# Patient Record
Sex: Female | Born: 1935 | Race: White | Hispanic: No | Smoking: Never smoker
Health system: Southern US, Community
[De-identification: ages and names within clinical notes are randomized; demographics above are authoritative.]

## PROBLEM LIST (undated history)

## (undated) DIAGNOSIS — I447 Left bundle-branch block, unspecified: Secondary | ICD-10-CM

## (undated) DIAGNOSIS — E039 Hypothyroidism, unspecified: Secondary | ICD-10-CM

## (undated) DIAGNOSIS — I10 Essential (primary) hypertension: Secondary | ICD-10-CM

## (undated) DIAGNOSIS — I499 Cardiac arrhythmia, unspecified: Secondary | ICD-10-CM

## (undated) DIAGNOSIS — R7303 Prediabetes: Secondary | ICD-10-CM

## (undated) DIAGNOSIS — E079 Disorder of thyroid, unspecified: Secondary | ICD-10-CM

## (undated) DIAGNOSIS — K219 Gastro-esophageal reflux disease without esophagitis: Secondary | ICD-10-CM

## (undated) DIAGNOSIS — F0390 Unspecified dementia without behavioral disturbance: Secondary | ICD-10-CM

## (undated) DIAGNOSIS — I219 Acute myocardial infarction, unspecified: Secondary | ICD-10-CM

## (undated) DIAGNOSIS — M1990 Unspecified osteoarthritis, unspecified site: Secondary | ICD-10-CM

## (undated) DIAGNOSIS — H903 Sensorineural hearing loss, bilateral: Secondary | ICD-10-CM

## (undated) DIAGNOSIS — Z972 Presence of dental prosthetic device (complete) (partial): Secondary | ICD-10-CM

## (undated) DIAGNOSIS — G3184 Mild cognitive impairment, so stated: Secondary | ICD-10-CM

## (undated) DIAGNOSIS — K449 Diaphragmatic hernia without obstruction or gangrene: Secondary | ICD-10-CM

## (undated) DIAGNOSIS — H9190 Unspecified hearing loss, unspecified ear: Secondary | ICD-10-CM

## (undated) HISTORY — PX: ABDOMINAL HYSTERECTOMY: SHX81

## (undated) HISTORY — PX: BUNIONECTOMY: SHX129

## (undated) HISTORY — PX: DENTAL SURGERY: SHX609

---

## 2004-08-20 ENCOUNTER — Ambulatory Visit: Payer: Self-pay | Admitting: Unknown Physician Specialty

## 2004-11-29 ENCOUNTER — Ambulatory Visit: Payer: Self-pay | Admitting: Gastroenterology

## 2005-08-25 ENCOUNTER — Ambulatory Visit: Payer: Self-pay | Admitting: Unknown Physician Specialty

## 2006-10-19 ENCOUNTER — Ambulatory Visit: Payer: Self-pay | Admitting: Unknown Physician Specialty

## 2007-11-02 ENCOUNTER — Ambulatory Visit: Payer: Self-pay | Admitting: Unknown Physician Specialty

## 2008-11-02 ENCOUNTER — Ambulatory Visit: Payer: Self-pay | Admitting: Unknown Physician Specialty

## 2009-11-26 ENCOUNTER — Ambulatory Visit: Payer: Self-pay | Admitting: Unknown Physician Specialty

## 2011-01-03 ENCOUNTER — Ambulatory Visit: Payer: Self-pay | Admitting: Unknown Physician Specialty

## 2011-04-24 ENCOUNTER — Emergency Department: Payer: Self-pay | Admitting: Emergency Medicine

## 2011-10-28 ENCOUNTER — Observation Stay: Payer: Self-pay | Admitting: Internal Medicine

## 2012-01-07 ENCOUNTER — Ambulatory Visit: Payer: Self-pay | Admitting: Unknown Physician Specialty

## 2012-01-20 ENCOUNTER — Ambulatory Visit: Payer: Self-pay | Admitting: Unknown Physician Specialty

## 2013-03-09 ENCOUNTER — Ambulatory Visit: Payer: Self-pay | Admitting: Unknown Physician Specialty

## 2013-07-13 ENCOUNTER — Emergency Department: Payer: Self-pay | Admitting: Emergency Medicine

## 2013-07-13 LAB — URINALYSIS, COMPLETE
Bacteria: NONE SEEN
Ketone: NEGATIVE
Leukocyte Esterase: NEGATIVE
Nitrite: NEGATIVE
Ph: 7 (ref 4.5–8.0)
Protein: NEGATIVE
RBC,UR: 1 /HPF (ref 0–5)
Specific Gravity: 1.006 (ref 1.003–1.030)

## 2013-07-13 LAB — COMPREHENSIVE METABOLIC PANEL
Alkaline Phosphatase: 71 U/L (ref 50–136)
Anion Gap: 5 — ABNORMAL LOW (ref 7–16)
BUN: 15 mg/dL (ref 7–18)
Bilirubin,Total: 0.3 mg/dL (ref 0.2–1.0)
Chloride: 104 mmol/L (ref 98–107)
Co2: 29 mmol/L (ref 21–32)
Creatinine: 0.7 mg/dL (ref 0.60–1.30)
EGFR (African American): >60
EGFR (Non-African Amer.): >60
Glucose: 108 mg/dL — ABNORMAL HIGH (ref 65–99)
Osmolality: 277 (ref 275–301)
Potassium: 4 mmol/L (ref 3.5–5.1)
SGPT (ALT): 32 U/L (ref 12–78)
Sodium: 138 mmol/L (ref 136–145)

## 2013-07-13 LAB — CBC
HGB: 13.3 g/dL (ref 12.0–16.0)
MCH: 31.2 pg (ref 26.0–34.0)
MCHC: 34.1 g/dL (ref 32.0–36.0)
RBC: 4.26 10*6/uL (ref 3.80–5.20)

## 2013-07-13 LAB — CK TOTAL AND CKMB (NOT AT ARMC)
CK, Total: 130 U/L (ref 21–215)
CK-MB: 1.6 ng/mL (ref 0.5–3.6)

## 2014-03-10 ENCOUNTER — Ambulatory Visit: Payer: Self-pay | Admitting: Internal Medicine

## 2014-03-21 ENCOUNTER — Ambulatory Visit: Payer: Self-pay | Admitting: Internal Medicine

## 2014-03-24 ENCOUNTER — Ambulatory Visit: Payer: Self-pay | Admitting: Internal Medicine

## 2014-03-24 HISTORY — PX: BREAST CYST ASPIRATION: SHX578

## 2015-02-16 ENCOUNTER — Other Ambulatory Visit: Payer: Self-pay | Admitting: Internal Medicine

## 2015-02-16 DIAGNOSIS — Z1231 Encounter for screening mammogram for malignant neoplasm of breast: Secondary | ICD-10-CM

## 2015-02-25 NOTE — Consult Note (Signed)
PATIENT NAME:  Jennifer Larsen, Jennifer Larsen MR#:  [id number] DATE OF BIRTH:  08-27-1936  DATE OF CONSULTATION:  10/29/2011  REFERRING PHYSICIAN:   CONSULTING PHYSICIAN:  Corey Skains, MD  REASON FOR CONSULTATION: Chest pain.   CHIEF COMPLAINT: I have chest pain.   HISTORY OF PRESENT ILLNESS: This is a 79 year old female with known hypertension, hyperlipidemia, abnormal EKG with left bundle branch block who has had some shortness of breath and chest discomfort occurring at night and relieved spontaneously as well as when she was seen in the Emergency Room. She had troponin, CK-MB showing no evidence of myocardial infarction. Her EKG shows normal sinus rhythm with left bundle branch block. The patient subsequently has undergone a Lexiscan infusion Myoview showing normal myocardial perfusion and normal LV systolic function. There have been no further episodes of chest discomfort. The patient has had no other issues.   Remainder of review of systems negative for vision change, ringing in the ears, hearing loss, cough, congestion, heartburn, nausea, vomiting, diarrhea, bloody stool, stomach pain, extremity pain, leg weakness, cramping of the buttocks, known blood clots, headaches, blackouts, dizzy spells, nosebleed, congestion, trouble swallowing, frequent urination, urination at night, muscle weakness, numbness, anxiety, depression, skin lesions, skin rashes.   PAST MEDICAL HISTORY:  1. Thyroid disease.  2. Hypertension.  3. Hyperlipidemia.   FAMILY HISTORY: Father died at 56 of myocardial infarction.   SOCIAL HISTORY: She occasionally drinks alcohol. Denies tobacco use.   ALLERGIES: No known drug allergies.   CURRENT MEDICATIONS: As listed.   PHYSICAL EXAMINATION:   VITAL SIGNS: Blood pressure 126/68 bilaterally, heart rate 78 upright, reclining, and regular.   GENERAL: She is a well appearing female in no acute distress.   HEENT: No icterus, thyromegaly, ulcers, hemorrhage, or xanthelasma.    CARDIOVASCULAR: Regular rate and rhythm. Normal S1, S2 without murmur, gallop, or rub. Point of maximal impulse is normal size and placement. Carotid upstroke normal without bruit. Jugular venous pressure is normal.   LUNGS: Lungs are clear to auscultation with normal respirations.   ABDOMEN: Soft, nontender without hepatosplenomegaly or masses. Abdominal aorta is normal size without bruit.   EXTREMITIES: 2+ bilateral pulses in dorsal, pedal, radial, and femoral arteries without lower extremity edema, cyanosis, clubbing, or ulcers.   NEUROLOGIC: She is oriented to time, place, and person with normal mood and affect.   ASSESSMENT: This is a 79 year old female with left bundle branch block, hypertension, and hyperlipidemia with atypical chest pain and no evidence of myocardial infarction with a normal stress test needing further treatment options.   RECOMMENDATIONS:  1. Ambulate and follow for any further symptoms.  2. Possible discharge to home with follow-up and further diagnostics as necessary.  3. Continue hypertension, hyperlipidemia, and risk factor modification with medications as needed.  ____________________________ Corey Skains, MD bjk:drc D: 11/01/2011 10:33:00 ET T: 11/01/2011 13:36:46 ET JOB#: [id number]  cc: Corey Skains, MD, <Dictator> Corey Skains MD ELECTRONICALLY SIGNED 11/10/2011 9:46

## 2015-03-28 ENCOUNTER — Ambulatory Visit
Admission: RE | Admit: 2015-03-28 | Discharge: 2015-03-28 | Disposition: A | Discharge status: Home / Self Care | Payer: Medicare Other | Source: Ambulatory Visit | Attending: Internal Medicine | Admitting: Internal Medicine

## 2015-03-28 ENCOUNTER — Other Ambulatory Visit: Payer: Self-pay | Admitting: Internal Medicine

## 2015-03-28 DIAGNOSIS — Z1231 Encounter for screening mammogram for malignant neoplasm of breast: Secondary | ICD-10-CM | POA: Diagnosis present

## 2015-06-18 ENCOUNTER — Ambulatory Visit
Admission: RE | Admit: 2015-06-18 | Discharge: 2015-06-18 | Disposition: A | Discharge status: Home / Self Care | Payer: Medicare Other | Source: Ambulatory Visit | Attending: Internal Medicine | Admitting: Internal Medicine

## 2015-06-18 ENCOUNTER — Other Ambulatory Visit: Payer: Self-pay | Admitting: Internal Medicine

## 2015-06-18 DIAGNOSIS — R51 Headache: Secondary | ICD-10-CM | POA: Insufficient documentation

## 2015-06-18 DIAGNOSIS — R519 Headache, unspecified: Secondary | ICD-10-CM

## 2015-06-18 DIAGNOSIS — I159 Secondary hypertension, unspecified: Secondary | ICD-10-CM

## 2016-03-24 ENCOUNTER — Other Ambulatory Visit: Payer: Self-pay | Admitting: Internal Medicine

## 2016-03-24 DIAGNOSIS — Z1231 Encounter for screening mammogram for malignant neoplasm of breast: Secondary | ICD-10-CM

## 2016-04-08 ENCOUNTER — Ambulatory Visit
Admission: RE | Admit: 2016-04-08 | Discharge: 2016-04-08 | Disposition: A | Discharge status: Home / Self Care | Payer: Medicare Other | Source: Ambulatory Visit | Attending: Internal Medicine | Admitting: Internal Medicine

## 2016-04-08 ENCOUNTER — Other Ambulatory Visit: Payer: Self-pay | Admitting: Internal Medicine

## 2016-04-08 DIAGNOSIS — Z1231 Encounter for screening mammogram for malignant neoplasm of breast: Secondary | ICD-10-CM | POA: Diagnosis not present

## 2016-11-25 ENCOUNTER — Encounter: Payer: Self-pay | Admitting: Emergency Medicine

## 2016-11-25 ENCOUNTER — Emergency Department: Payer: Medicare Other

## 2016-11-25 ENCOUNTER — Emergency Department
Admission: EM | Admit: 2016-11-25 | Discharge: 2016-11-25 | Disposition: A | Discharge status: Home / Self Care | Payer: Medicare Other | Attending: Emergency Medicine | Admitting: Emergency Medicine

## 2016-11-25 DIAGNOSIS — Z7982 Long term (current) use of aspirin: Secondary | ICD-10-CM | POA: Insufficient documentation

## 2016-11-25 DIAGNOSIS — I10 Essential (primary) hypertension: Secondary | ICD-10-CM | POA: Diagnosis not present

## 2016-11-25 DIAGNOSIS — R791 Abnormal coagulation profile: Secondary | ICD-10-CM | POA: Diagnosis not present

## 2016-11-25 DIAGNOSIS — R202 Paresthesia of skin: Secondary | ICD-10-CM

## 2016-11-25 DIAGNOSIS — Z09 Encounter for follow-up examination after completed treatment for conditions other than malignant neoplasm: Secondary | ICD-10-CM

## 2016-11-25 DIAGNOSIS — Z79899 Other long term (current) drug therapy: Secondary | ICD-10-CM | POA: Insufficient documentation

## 2016-11-25 HISTORY — DX: Essential (primary) hypertension: I10

## 2016-11-25 HISTORY — DX: Gastro-esophageal reflux disease without esophagitis: K21.9

## 2016-11-25 HISTORY — DX: Disorder of thyroid, unspecified: E07.9

## 2016-11-25 LAB — URINE DRUG SCREEN, QUALITATIVE (ARMC ONLY)
AMPHETAMINES, UR SCREEN: NOT DETECTED
Barbiturates, Ur Screen: NOT DETECTED
Benzodiazepine, Ur Scrn: NOT DETECTED
COCAINE METABOLITE, UR ~~LOC~~: NOT DETECTED
Cannabinoid 50 Ng, Ur ~~LOC~~: NOT DETECTED
MDMA (ECSTASY) UR SCREEN: NOT DETECTED
Methadone Scn, Ur: NOT DETECTED
Opiate, Ur Screen: NOT DETECTED
PHENCYCLIDINE (PCP) UR S: NOT DETECTED
Tricyclic, Ur Screen: NOT DETECTED

## 2016-11-25 LAB — DIFFERENTIAL
BASOS ABS: 0 10*3/uL (ref 0–0.1)
Basophils Relative: 1 %
EOS ABS: 0.1 10*3/uL (ref 0–0.7)
Eosinophils Relative: 2 %
LYMPHS ABS: 1.4 10*3/uL (ref 1.0–3.6)
LYMPHS PCT: 25 %
MONOS PCT: 7 %
Monocytes Absolute: 0.4 10*3/uL (ref 0.2–0.9)
Neutro Abs: 3.8 10*3/uL (ref 1.4–6.5)
Neutrophils Relative %: 65 %

## 2016-11-25 LAB — COMPREHENSIVE METABOLIC PANEL
ALK PHOS: 60 U/L (ref 38–126)
ALT: 19 U/L (ref 14–54)
AST: 22 U/L (ref 15–41)
Albumin: 4.3 g/dL (ref 3.5–5.0)
Anion gap: 8 (ref 5–15)
BILIRUBIN TOTAL: 0.8 mg/dL (ref 0.3–1.2)
BUN: 13 mg/dL (ref 6–20)
CALCIUM: 9.1 mg/dL (ref 8.9–10.3)
CO2: 26 mmol/L (ref 22–32)
CREATININE: 0.55 mg/dL (ref 0.44–1.00)
Chloride: 103 mmol/L (ref 101–111)
GFR calc Af Amer: >60 mL/min (ref >60)
Glucose, Bld: 111 mg/dL — ABNORMAL HIGH (ref 65–99)
POTASSIUM: 3.7 mmol/L (ref 3.5–5.1)
Sodium: 137 mmol/L (ref 135–145)
TOTAL PROTEIN: 7.4 g/dL (ref 6.5–8.1)

## 2016-11-25 LAB — CBC
HEMATOCRIT: 38.4 % (ref 35.0–47.0)
HEMOGLOBIN: 13.2 g/dL (ref 12.0–16.0)
MCH: 31.2 pg (ref 26.0–34.0)
MCHC: 34.5 g/dL (ref 32.0–36.0)
MCV: 90.5 fL (ref 80.0–100.0)
Platelets: 283 10*3/uL (ref 150–440)
RBC: 4.24 MIL/uL (ref 3.80–5.20)
RDW: 13.2 % (ref 11.5–14.5)
WBC: 5.8 10*3/uL (ref 3.6–11.0)

## 2016-11-25 LAB — URINALYSIS, ROUTINE W REFLEX MICROSCOPIC
Bacteria, UA: NONE SEEN
Bilirubin Urine: NEGATIVE
GLUCOSE, UA: NEGATIVE mg/dL
Ketones, ur: NEGATIVE mg/dL
Leukocytes, UA: NEGATIVE
Nitrite: NEGATIVE
PH: 8 (ref 5.0–8.0)
Protein, ur: NEGATIVE mg/dL
SPECIFIC GRAVITY, URINE: 1.002 — AB (ref 1.005–1.030)
SQUAMOUS EPITHELIAL / LPF: NONE SEEN

## 2016-11-25 LAB — APTT: APTT: 26 s (ref 24–36)

## 2016-11-25 LAB — GLUCOSE, CAPILLARY: Glucose-Capillary: 113 mg/dL — ABNORMAL HIGH (ref 65–99)

## 2016-11-25 LAB — PROTIME-INR
INR: 0.91
Prothrombin Time: 12.2 seconds (ref 11.4–15.2)

## 2016-11-25 LAB — ETHANOL: Alcohol, Ethyl (B): <5 mg/dL (ref <5)

## 2016-11-25 MED ORDER — LORAZEPAM 2 MG/ML IJ SOLN
0.5000 mg | Freq: Once | INTRAMUSCULAR | Status: AC
  Administered 2016-11-25: 0.5 mg via INTRAVENOUS
  Filled 2016-11-25: qty 1

## 2016-11-25 MED ORDER — GADOBENATE DIMEGLUMINE 529 MG/ML IV SOLN
10.0000 mL | Freq: Once | INTRAVENOUS | Status: AC | PRN
  Administered 2016-11-25: 10 mL via INTRAVENOUS

## 2016-11-25 NOTE — ED Notes (Signed)
Patient returned from MRI.

## 2016-11-25 NOTE — ED Notes (Signed)
Patient transported to MRI 

## 2016-11-25 NOTE — ED Notes (Signed)
CBG monitoring done

## 2016-11-25 NOTE — ED Notes (Addendum)
Patient resting on stretcher. Friend at bedside. NAD noted.

## 2016-11-25 NOTE — ED Triage Notes (Signed)
Says she started feeling strange about 930 or 10 am today  Has tingling in left arm and right leg at some point since then.  Says he blood pressure was up this am.

## 2016-11-25 NOTE — ED Notes (Signed)
Pt states this AM at around 9 or 930 this AM pt "did not feel well", states feeling jittery and lightheaded, states she then began to feel tingling in her right foot and left arm and hand, at present pt states tingling in her right leg, pt states feeling lightheaded, awake and alert, [name] at bedside

## 2016-11-25 NOTE — Consult Note (Addendum)
Referring Physician: Karma Greaser    Chief Complaint: Paresthesias  HPI: Jennifer Larsen is an 81 y.o. female with a history of HTN who reports that this morning she began to feel light headed.  Checked her BP and it was elevated.  Then noted tingling in her right foot and leg and tingling in her left hand and arm.  She took ASA and laid down.  With no improvement in her symptoms she presented for evaluation.  Symptoms improved at this time.  Initial NIHSS of 1.   Patient previously on ASA.  Stopped about 6 months ago.    Date last known well: Date: 11/25/2016 Time last known well: Time: 09:00 tPA Given: No: Improvement in symptoms  Past Medical History:  Diagnosis Date  . GERD (gastroesophageal reflux disease)   . Hypertension   . Thyroid disease     Past Surgical History:  Procedure Laterality Date  . BREAST CYST ASPIRATION Left 03/24/2014    Family History  Problem Relation Age of Onset  . Breast cancer Mother 90  . Breast cancer Maternal Aunt 82   Social History:  reports that she has never smoked. She has never used smokeless tobacco. She reports that she does not drink alcohol. Her drug history is not on file.  Allergies:  Allergies  Allergen Reactions  . Cephalexin   . Clarithromycin   . Ofloxacin     Medications: I have reviewed the patient's current medications. Prior to Admission:  Prior to Admission medications   Medication Sig Start Date End Date Taking? Authorizing Provider  amLODipine (NORVASC) 2.5 MG tablet Take 1 tablet by mouth daily. 10/24/16  Yes Historical Provider, MD  aspirin 81 MG chewable tablet Chew 81 mg by mouth once.   Yes Historical Provider, MD  calcium-vitamin D (SM CALCIUM 500/VITAMIN D3) 500-400 MG-UNIT tablet Take 1 tablet by mouth daily.   Yes Historical Provider, MD  Coenzyme Q10 (COQ10 PO) Take 1 tablet by mouth daily.   Yes Historical Provider, MD  lansoprazole (PREVACID) 30 MG capsule Take 1 capsule by mouth daily. 09/29/16  Yes  Historical Provider, MD  LUTEIN PO Take by mouth.   Yes Historical Provider, MD  Multiple Vitamin (MULTI-VITAMINS) TABS Take 1 tablet by mouth daily.   Yes Historical Provider, MD  SYNTHROID 100 MCG tablet Take 1 tablet by mouth daily. 10/24/16  Yes Historical Provider, MD    ROS: History obtained from the patient  General ROS: negative for - chills, fatigue, fever, night sweats, weight gain or weight loss Psychological ROS: negative for - behavioral disorder, hallucinations, memory difficulties, mood swings or suicidal ideation Ophthalmic ROS: negative for - blurry vision, double vision, eye pain or loss of vision ENT ROS: negative for - epistaxis, nasal discharge, oral lesions, sore throat, tinnitus or vertigo Allergy and Immunology ROS: negative for - hives or itchy/watery eyes Hematological and Lymphatic ROS: negative for - bleeding problems, bruising or swollen lymph nodes Endocrine ROS: negative for - galactorrhea, hair pattern changes, polydipsia/polyuria or temperature intolerance Respiratory ROS: negative for - cough, hemoptysis, shortness of breath or wheezing Cardiovascular ROS: negative for - chest pain, dyspnea on exertion, edema or irregular heartbeat Gastrointestinal ROS: negative for - abdominal pain, diarrhea, hematemesis, nausea/vomiting or stool incontinence Genito-Urinary ROS: negative for - dysuria, hematuria, incontinence or urinary frequency/urgency Musculoskeletal ROS: negative for - joint swelling or muscular weakness Neurological ROS: as noted in HPI Dermatological ROS: negative for rash and skin lesion changes  Physical Examination: Blood pressure (!) 160/77, pulse 72,  temperature 98.3 F (36.8 C), temperature source Oral, resp. rate 16, height 5' (1.524 m), weight 58.1 kg (128 lb), SpO2 97 %.  HEENT-  Normocephalic, no lesions, without obvious abnormality.  Normal external eye and conjunctiva.  Normal TM's bilaterally.  Normal auditory canals and external ears.  Normal external nose, mucus membranes and septum.  Normal pharynx. Cardiovascular- S1, S2 normal, pulses palpable throughout   Lungs- chest clear, no wheezing, rales, normal symmetric air entry Abdomen- soft, non-tender; bowel sounds normal; no masses,  no organomegaly Extremities- no edema Lymph-no adenopathy palpable Musculoskeletal-no joint tenderness, deformity or swelling Skin-warm and dry, no hyperpigmentation, vitiligo, or suspicious lesions  Neurological Examination Mental Status: Alert, oriented, thought content appropriate.  Speech fluent without evidence of aphasia.  Able to follow 3 step commands without difficulty. Cranial Nerves: II: Discs flat bilaterally; Visual fields grossly normal, pupils equal, round, reactive to light and accommodation III,IV, VI: ptosis not present, extra-ocular motions intact bilaterally V,VII: decrease in right NLF, facial light touch sensation normal bilaterally VIII: hearing normal bilaterally IX,X: gag reflex present XI: bilateral shoulder shrug XII: midline tongue extension Motor: Right : Upper extremity   5/5    Left:     Upper extremity   5/5  Lower extremity   5/5     Lower extremity   5/5 Tone and bulk:normal tone throughout; no atrophy noted Sensory: Pinprick and light touch intact throughout, bilaterally Deep Tendon Reflexes: 2+ and symmetric with absent AJ's Plantars: Right: mute   Left: mute Cerebellar: Normal finger-to-nose and normal heel-to-shin testing bilaterally Gait: normal gait and station    Laboratory Studies:  Basic Metabolic Panel: No results for input(s): NA, K, CL, CO2, GLUCOSE, BUN, CREATININE, CALCIUM, MG, PHOS in the last 168 hours.  Liver Function Tests: No results for input(s): AST, ALT, ALKPHOS, BILITOT, PROT, ALBUMIN in the last 168 hours. No results for input(s): LIPASE, AMYLASE in the last 168 hours. No results for input(s): AMMONIA in the last 168 hours.  CBC:  Recent Labs Lab 11/25/16 1415   WBC 5.8  NEUTROABS 3.8  HGB 13.2  HCT 38.4  MCV 90.5  PLT 283    Cardiac Enzymes: No results for input(s): CKTOTAL, CKMB, CKMBINDEX, TROPONINI in the last 168 hours.  BNP: Invalid input(s): POCBNP  CBG:  Recent Labs Lab 11/25/16 1412  GLUCAP 113*    Microbiology: No results found for this or any previous visit.  Coagulation Studies: No results for input(s): LABPROT, INR in the last 72 hours.  Urinalysis: No results for input(s): COLORURINE, LABSPEC, PHURINE, GLUCOSEU, HGBUR, BILIRUBINUR, KETONESUR, PROTEINUR, UROBILINOGEN, NITRITE, LEUKOCYTESUR in the last 168 hours.  Invalid input(s): APPERANCEUR  Lipid Panel: No results found for: CHOL, TRIG, HDL, CHOLHDL, VLDL, LDLCALC  HgbA1C: No results found for: HGBA1C  Urine Drug Screen:  No results found for: LABOPIA, COCAINSCRNUR, LABBENZ, AMPHETMU, THCU, LABBARB  Alcohol Level: No results for input(s): ETH in the last 168 hours.  Other results: EKG: normal sinus rhythm at 79 bpm, LBBB.  Imaging: Ct Head Code Stroke W/o Cm  Result Date: 11/25/2016 CLINICAL DATA:  Code stroke. Tingling in the right leg. Also report of left arm and hand tingling at some point today. EXAM: CT HEAD WITHOUT CONTRAST TECHNIQUE: Contiguous axial images were obtained from the base of the skull through the vertex without intravenous contrast. COMPARISON:  06/18/2015 FINDINGS: Brain: Focal hypodensity in the anterior limb of the right internal capsule was present on the prior CT (though partially obscured by motion) and is compatible  with a chronic lacunar infarct. There is no evidence of acute cortical infarct, intracranial hemorrhage, mass, midline shift, or extra-axial fluid collection. The ventricles and sulci are normal. Periventricular white matter hypodensities are nonspecific but compatible with chronic small vessel ischemic disease, minimal for age. Vascular: No hyperdense vessel or unexpected calcification. Skull: No fracture or focal osseous  lesion. Sinuses/Orbits: Visualized paranasal sinuses and mastoid air cells are clear. Visualized orbits are unremarkable. Other: None. ASPECTS [facility] Stroke Program Early CT Score) - Ganglionic level infarction (caudate, lentiform nuclei, internal capsule, insula, M1-M3 cortex): 7 - Supraganglionic infarction (M4-M6 cortex): 3 Total score (0-10 with 10 being normal): 10 IMPRESSION: 1. No evidence of acute intracranial abnormality. 2. ASPECTS is 10. 3. Chronic lacunar infarct in the right internal capsule. These results were called by telephone at the time of interpretation on 11/25/2016 at 2:20 pm to Dr. Nance Pear , who verbally acknowledged these results. Electronically Signed   By: Logan Bores M.D.   On: 11/25/2016 14:22    Assessment: 81 y.o. female presenting with complaints of lightheadedness, and paresthesias in the LUE and RLE.  Symptoms have now resolved.  Head CT reviewed and shows no acute changes.  BP improving from what she took at home.  Patient on no antiplatelet therapy.   Symptoms may very well have been related to BP.  Do not suspect an acute ischemic event.    Stroke Risk Factors - hypertension  Plan: 1. HgbA1c, fasting lipid panel 2. MRI of the brain without contrast.  If unremarkable no further neurological work up indicated at this time.   3. Telemetry monitoring 4. Frequent neuro checks 5. ASA 81mg  daily 6. BP control  Case discussed with Dr. Worthy Rancher, MD Neurology [phone] 11/25/2016, 3:02 PM

## 2016-11-25 NOTE — ED Notes (Signed)
Called code stroke [phone]

## 2016-11-25 NOTE — ED Provider Notes (Signed)
[facility] Emergency Department Provider Note  ____________________________________________   First MD Initiated Contact with Patient 11/25/16 1409     (approximate)  I have reviewed the triage vital signs and the nursing notes.   HISTORY  Chief Complaint Numbness    HPI Jennifer Larsen is a 81 y.o. female who presents as a code stroke from triage.  She reports that she felt off today and jittery and lightheaded starting early this morning, at least 5 hours prior to arrival.  She then developed some tingling in her right foot and her left arm and hand.  She states that she still has some tingling in her right leg and feels lightheaded.  Her blood pressure was elevated over baseline.  She denies headache, shortness of breath, nausea, vomiting, chest pain, abdominal pain.  She has had no recent infectious symptoms.  She has no weakness, simply the sensation of tingling and the lightheadedness.Nothing makes her symptoms better nor worse and she describes them as mild.  He was identified as a code stroke in triage and taken immediately for emergent noncontrast head CT.  [name] the neurologist was contacted for immediate evaluation as well.   Past Medical History:  Diagnosis Date  . GERD (gastroesophageal reflux disease)   . Hypertension   . Thyroid disease     There are no active problems to display for this patient.   Past Surgical History:  Procedure Laterality Date  . BREAST CYST ASPIRATION Left 03/24/2014    Prior to Admission medications   Medication Sig Start Date End Date Taking? Authorizing Provider  amLODipine (NORVASC) 2.5 MG tablet Take 1 tablet by mouth daily. 10/24/16  Yes Historical Provider, MD  aspirin 81 MG chewable tablet Chew 81 mg by mouth once.   Yes Historical Provider, MD  calcium-vitamin D (SM CALCIUM 500/VITAMIN D3) 500-400 MG-UNIT tablet Take 1 tablet by mouth daily.   Yes Historical Provider, MD  Coenzyme Q10  (COQ10 PO) Take 1 tablet by mouth daily.   Yes Historical Provider, MD  lansoprazole (PREVACID) 30 MG capsule Take 1 capsule by mouth daily. 09/29/16  Yes Historical Provider, MD  LUTEIN PO Take by mouth.   Yes Historical Provider, MD  Multiple Vitamin (MULTI-VITAMINS) TABS Take 1 tablet by mouth daily.   Yes Historical Provider, MD  SYNTHROID 100 MCG tablet Take 1 tablet by mouth daily. 10/24/16  Yes Historical Provider, MD    Allergies Cephalexin; Clarithromycin; and Ofloxacin  Family History  Problem Relation Age of Onset  . Breast cancer Mother 68  . Breast cancer Maternal Aunt 50    Social History Social History  Substance Use Topics  . Smoking status: Never Smoker  . Smokeless tobacco: Never Used  . Alcohol use No    Review of Systems Constitutional: No fever/chills Eyes: No visual changes. ENT: No sore throat. Cardiovascular: Denies chest pain. Respiratory: Denies shortness of breath. Gastrointestinal: No abdominal pain.  No nausea, no vomiting.  No diarrhea.  No constipation. Genitourinary: Negative for dysuria. Musculoskeletal: Negative for back pain. Skin: Negative for rash. Neurological: Mild tingling in her right leg and previously in her left arm and hand.  Some sensation of lightheadedness.  No headache  10-point ROS otherwise negative.  ____________________________________________   PHYSICAL EXAM:  VITAL SIGNS: ED Triage Vitals  Enc Vitals Group     BP 11/25/16 1350 (!) 150/77     Pulse Rate 11/25/16 1350 80     Resp 11/25/16 1350 16  Temp 11/25/16 1350 98.3 F (36.8 C)     Temp Source 11/25/16 1350 Oral     SpO2 11/25/16 1350 96 %     Weight 11/25/16 1350 128 lb (58.1 kg)     Height 11/25/16 1350 5' (1.524 m)     Head Circumference --      Peak Flow --      Pain Score 11/25/16 1351 0     Pain Loc --      Pain Edu? --      Excl. in [place]? --     Constitutional: Alert and oriented. Well appearing and in no acute distress. Eyes:  Conjunctivae are normal. PERRL. EOMI. Head: Atraumatic. Nose: No congestion/rhinnorhea. Mouth/Throat: Mucous membranes are moist.  Oropharynx non-erythematous. Neck: No stridor.  No meningeal signs.   Cardiovascular: Normal rate, regular rhythm. Good peripheral circulation. Grossly normal heart sounds. Respiratory: Normal respiratory effort.  No retractions. Lungs CTAB. Gastrointestinal: Soft and nontender. No distention.  Musculoskeletal: No lower extremity tenderness nor edema. No gross deformities of extremities. Neurologic:  Normal speech and language. No gross focal neurologic deficits are appreciated. See NIHSS below. Skin:  Skin is warm, dry and intact. No rash noted. Psychiatric: Mood and affect are normal. Speech and behavior are normal.  ____________________________________________   LABS (all labs ordered are listed, but only abnormal results are displayed)  Labs Reviewed  COMPREHENSIVE METABOLIC PANEL - Abnormal; Notable for the following:       Result Value   Glucose, Bld 111 (*)    All other components within normal limits  URINALYSIS, ROUTINE W REFLEX MICROSCOPIC - Abnormal; Notable for the following:    Color, Urine COLORLESS (*)    APPearance CLEAR (*)    Specific Gravity, Urine 1.002 (*)    Hgb urine dipstick SMALL (*)    All other components within normal limits  GLUCOSE, CAPILLARY - Abnormal; Notable for the following:    Glucose-Capillary 113 (*)    All other components within normal limits  ETHANOL  PROTIME-INR  APTT  CBC  DIFFERENTIAL  URINE DRUG SCREEN, QUALITATIVE ([facility] ONLY)  TROPONIN I   ____________________________________________  EKG  ED ECG REPORT I, [name], the attending physician, personally viewed and interpreted this ECG.  Date: 11/25/2016 EKG Time: 13:54 Rate: 79 Rhythm: LBBB QRS Axis: normal Intervals: normal ST/T Wave abnormalities: normal Conduction Disturbances: none Narrative Interpretation:  unremarkable  ____________________________________________  RADIOLOGY   Mr Jeri Cos Wo Contrast  Result Date: 11/25/2016 CLINICAL DATA:  Hypertension, lightheadedness today with RIGHT leg and LEFT arm tingling. Assess for stroke. History of hypertension. EXAM: MRI HEAD WITHOUT AND WITH CONTRAST TECHNIQUE: Multiplanar, multiecho pulse sequences of the brain and surrounding structures were obtained without and with intravenous contrast. CONTRAST:  88mL MULTIHANCE GADOBENATE DIMEGLUMINE 529 MG/ML IV SOLN COMPARISON:  CT HEAD November 25, 2016 at 1408 hours FINDINGS: BRAIN: No reduced diffusion to suggest acute ischemia. No susceptibility artifact to suggest hemorrhage. The ventricles and sulci are normal for patient's age. Subcentimeter nodular intermediate to bright T2 signal RIGHT caudate extending to internal capsule without enhancement. Scattered subcentimeter supratentorial white matter FLAIR T2 hyperintensities. No suspicious parenchymal signal, masses or mass effect. No abnormal extra-axial fluid collections. No extra-axial masses or enhancement. VASCULAR: Normal major intracranial vascular flow voids present at skull base. SKULL AND UPPER CERVICAL SPINE: No abnormal sellar expansion. No suspicious calvarial bone marrow signal. Craniocervical junction maintained. SINUSES/ORBITS: The mastoid air-cells and included paranasal sinuses are well-aerated. The included ocular globes and orbital contents  are non-suspicious. OTHER: None. IMPRESSION: No acute intracranial process, specifically no acute ischemia. Nonenhancing subcentimeter nodule RIGHT caudate to internal capsule ; no lacunar infarct. Recommend six-month follow-up MRI head to verify stability of findings. Mild chronic small vessel ischemic disease. Preliminary findings discussed with and reconfirmed by Dr.Viva Gallaher [facility] on 11/25/2016 at 4:21 pm. Electronically Signed   By: Elon Alas M.D.   On: 11/25/2016 17:50   Ct Head Code Stroke W/o  Cm  Result Date: 11/25/2016 CLINICAL DATA:  Code stroke. Tingling in the right leg. Also report of left arm and hand tingling at some point today. EXAM: CT HEAD WITHOUT CONTRAST TECHNIQUE: Contiguous axial images were obtained from the base of the skull through the vertex without intravenous contrast. COMPARISON:  06/18/2015 FINDINGS: Brain: Focal hypodensity in the anterior limb of the right internal capsule was present on the prior CT (though partially obscured by motion) and is compatible with a chronic lacunar infarct. There is no evidence of acute cortical infarct, intracranial hemorrhage, mass, midline shift, or extra-axial fluid collection. The ventricles and sulci are normal. Periventricular white matter hypodensities are nonspecific but compatible with chronic small vessel ischemic disease, minimal for age. Vascular: No hyperdense vessel or unexpected calcification. Skull: No fracture or focal osseous lesion. Sinuses/Orbits: Visualized paranasal sinuses and mastoid air cells are clear. Visualized orbits are unremarkable. Other: None. ASPECTS [facility] Stroke Program Early CT Score) - Ganglionic level infarction (caudate, lentiform nuclei, internal capsule, insula, M1-M3 cortex): 7 - Supraganglionic infarction (M4-M6 cortex): 3 Total score (0-10 with 10 being normal): 10 IMPRESSION: 1. No evidence of acute intracranial abnormality. 2. ASPECTS is 10. 3. Chronic lacunar infarct in the right internal capsule. These results were called by telephone at the time of interpretation on 11/25/2016 at 2:20 pm to Dr. Nance Pear , who verbally acknowledged these results. Electronically Signed   By: Logan Bores M.D.   On: 11/25/2016 14:22    ____________________________________________   PROCEDURES  Procedure(s) performed:   .Critical Care Performed by: Hinda Kehr Authorized by: Hinda Kehr   Critical care provider statement:    Critical care time (minutes):  30   Critical care time was  exclusive of:  Separately billable procedures and treating other patients   Critical care was necessary to treat or prevent imminent or life-threatening deterioration of the following conditions:  CNS failure or compromise   Critical care was time spent personally by me on the following activities:  Development of treatment plan with patient or surrogate, discussions with consultants, evaluation of patient's response to treatment, examination of patient, obtaining history from patient or surrogate, ordering and performing treatments and interventions, ordering and review of laboratory studies, ordering and review of radiographic studies, pulse oximetry, re-evaluation of patient's condition and review of old charts      Critical Care performed: Yes, see critical care procedure note(s) ____________________________________________   INITIAL IMPRESSION / [place] / ED COURSE  Pertinent labs & imaging results that were available during my care of the patient were reviewed by me and considered in my medical decision making (see chart for details).  NIH Stroke Scale  Interval: Baseline Time: 14:15 Person Administering Scale: Dillion Stowers  Administer stroke scale items in the order listed. Record performance in each category after each subscale exam. Do not go back and change scores. Follow directions provided for each exam technique. Scores should reflect what the patient does, not what the clinician thinks the patient can do. The clinician should record answers while administering the  exam and work quickly. Except where indicated, the patient should not be coached (i.e., repeated requests to patient to make a special effort).   1a  Level of consciousness: 0=alert; keenly responsive  1b. LOC questions:  0=Performs both tasks correctly  1c. LOC commands: 0=Performs both tasks correctly  2.  Best Gaze: 0=normal  3.  Visual: 0=No visual loss  4. Facial Palsy: 0=Normal symmetric  movement  5a.  Motor left arm: 0=No drift, limb holds 90 (or 45) degrees for full 10 seconds  5b.  Motor right arm: 0=No drift, limb holds 90 (or 45) degrees for full 10 seconds  6a. motor left leg: 0=No drift, limb holds 90 (or 45) degrees for full 10 seconds  6b  Motor right leg:  0=No drift, limb holds 90 (or 45) degrees for full 10 seconds  7. Limb Ataxia: 0=Absent  8.  Sensory: 1=Mild to moderate sensory loss; patient feels pinprick is less sharp or is dull on the affected side; there is a loss of superficial pain with pinprick but patient is aware She is being touched  9. Best Language:  0=No aphasia, normal  10. Dysarthria: 0=Normal  11. Extinction and Inattention: 0=No abnormality  12. Distal motor function: 0=Normal   Total:   1   The patient is NOT a tPA candidate given the minimal symptoms.  [name] agrees with this assessment.  The patient was made an acute code stroke and we will proceed with the typical workup.  She is protecting her airway and in no acute distress at this time.   Clinical Course as of Nov 26 1847  Tue Nov 25, 2016  1415 No acute findings on non-con head CT per radiology. CT Head Code Stroke W/O CM [CF]  1426 Discussed case in person with [name].  We agree that there are no concerning signs and symptoms for acute CVA.  She has at most an NIH stroke scale of 1.  [name] feels that there is likely little benefit to bringing her into the hospital.  We agreed and discussed with the patient that we will obtain an MR brain without contrast to rule out acute CVA.  If this is normal she should be able to be discharged home for outpatient follow-up.  The patient and family understand and agree with this plan.  [CF]  1518 Patient anxious about MRI.  Will give small dose of Ativan 0.5 mg IV to facilitate imaging (will keep dose as low as possible given patient's age).  [CF]  1640 Dr. Dorann Lodge with radiology called me to discuss MR results.  She sees an  abnormality in the basal ganglia but cannot completely characterize it.  She recommends that we send the patient back to MR for a study with gadolinium contrast.  Dr. Dorann Lodge is calling the MR technologist to explain the plan and I have placed the order and updated the patient.  [CF]    Clinical Course User Index [CF] Hinda Kehr, MD    ____________________________________________  FINAL CLINICAL IMPRESSION(S) / ED DIAGNOSES  Final diagnoses:  Paresthesias     MEDICATIONS GIVEN DURING THIS VISIT:  Medications  LORazepam (ATIVAN) injection 0.5 mg (0.5 mg Intravenous Given 11/25/16 1530)  gadobenate dimeglumine (MULTIHANCE) injection 10 mL (10 mLs Intravenous Contrast Given 11/25/16 1739)     NEW OUTPATIENT MEDICATIONS STARTED DURING THIS VISIT:  New Prescriptions   No medications on file    Modified Medications   No medications on file    Discontinued  Medications   No medications on file     Note:  This document was prepared using Dragon voice recognition software and may include unintentional dictation errors.    Hinda Kehr, MD 11/25/16 [phone]

## 2016-11-25 NOTE — Progress Notes (Signed)
[place] responded to a PG for a CODE Stroke. Pt was returning from CT on my arrival. Sister-In-Law drove the Pt here and was bedside. Pt complained of tingling in her leg and hands, but said it was much better now. EDP examined the Pt and recommended an MRI to get a better look. [place] informed Pt and family of our availability 24/7. [facility] is available for follow up as needed.    11/25/16 1400  Clinical Encounter Type  Visited With Patient;Patient and family together;Health care provider  Visit Type Initial;Spiritual support;Code;ED  Referral From Nurse  Consult/Referral To Chaplain  Spiritual Encounters  Spiritual Needs Emotional  Stress Factors  Patient Stress Factors Health changes

## 2016-11-25 NOTE — ED Notes (Signed)
Dr. Reynolds at bedside.

## 2016-11-25 NOTE — Discharge Instructions (Signed)
As we discussed, your workup today was reassuring.  Though we do not know exactly what is causing your symptoms, it appears that you have no emergent medical condition at this time and that you are safe to go home and follow up as recommended in this paperwork.  We did find a small (sub-centimeter) lesion in your brain (as per the radiologist, a "nonenhancing subcentimeter nodule RIGHT caudate to internal capsule").  She recommended follow up with your primary care doctor and neurology, as well as a repeat MRI of the brain in 6 months to make sure the nodule is stable and not enlarging.  Your primary care doctor may be able to help you establish care with a local neurologist, but we do recommend close follow up.  Please also take a daily 81 mg baby aspirin as we discussed.  Please return immediately to the Emergency Department if you develop any new or worsening symptoms that concern you.

## 2017-02-26 ENCOUNTER — Other Ambulatory Visit: Payer: Self-pay | Admitting: Internal Medicine

## 2017-02-26 DIAGNOSIS — Z1231 Encounter for screening mammogram for malignant neoplasm of breast: Secondary | ICD-10-CM

## 2017-03-03 DIAGNOSIS — I499 Cardiac arrhythmia, unspecified: Secondary | ICD-10-CM

## 2017-03-03 HISTORY — DX: Cardiac arrhythmia, unspecified: I49.9

## 2017-03-12 ENCOUNTER — Emergency Department: Payer: Medicare Other

## 2017-03-12 ENCOUNTER — Emergency Department
Admission: EM | Admit: 2017-03-12 | Discharge: 2017-03-12 | Disposition: A | Discharge status: Home / Self Care | Payer: Medicare Other | Attending: Emergency Medicine | Admitting: Emergency Medicine

## 2017-03-12 DIAGNOSIS — Y92030 Kitchen in apartment as the place of occurrence of the external cause: Secondary | ICD-10-CM | POA: Insufficient documentation

## 2017-03-12 DIAGNOSIS — S52572A Other intraarticular fracture of lower end of left radius, initial encounter for closed fracture: Secondary | ICD-10-CM | POA: Insufficient documentation

## 2017-03-12 DIAGNOSIS — S6992XA Unspecified injury of left wrist, hand and finger(s), initial encounter: Secondary | ICD-10-CM | POA: Diagnosis present

## 2017-03-12 DIAGNOSIS — W010XXA Fall on same level from slipping, tripping and stumbling without subsequent striking against object, initial encounter: Secondary | ICD-10-CM | POA: Diagnosis not present

## 2017-03-12 DIAGNOSIS — Y999 Unspecified external cause status: Secondary | ICD-10-CM | POA: Diagnosis not present

## 2017-03-12 DIAGNOSIS — I10 Essential (primary) hypertension: Secondary | ICD-10-CM | POA: Diagnosis not present

## 2017-03-12 DIAGNOSIS — Y9341 Activity, dancing: Secondary | ICD-10-CM | POA: Insufficient documentation

## 2017-03-12 DIAGNOSIS — Z79899 Other long term (current) drug therapy: Secondary | ICD-10-CM | POA: Insufficient documentation

## 2017-03-12 MED ORDER — OXYCODONE-ACETAMINOPHEN 5-325 MG PO TABS: 1.0000 | ORAL_TABLET | Freq: Four times a day (QID) | ORAL | 0 refills | Status: AC | PRN

## 2017-03-12 MED ORDER — MORPHINE SULFATE (PF) 2 MG/ML IV SOLN
2.0000 mg | Freq: Once | INTRAVENOUS | Status: AC
  Administered 2017-03-12: 2 mg via INTRAVENOUS
  Filled 2017-03-12: qty 1

## 2017-03-12 MED ORDER — MORPHINE SULFATE (PF) 2 MG/ML IV SOLN: 2.0000 mg | Freq: Once | INTRAVENOUS | Status: DC

## 2017-03-12 MED ORDER — ONDANSETRON HCL 4 MG/2ML IJ SOLN
4.0000 mg | Freq: Once | INTRAMUSCULAR | Status: AC
  Administered 2017-03-12: 4 mg via INTRAVENOUS
  Filled 2017-03-12: qty 2

## 2017-03-12 NOTE — ED Notes (Signed)
X-ray at bedside

## 2017-03-12 NOTE — ED Triage Notes (Signed)
Pt arrived by [facility]. States she was dancing the kitchen with her grandchild and tripped and fell. She landed on her left wrist/hand and hip. Last 3 fingers hurt to move or apply pressure and pain shoots up the arm. She states that she did not hit her head nor lose LOC. She has a 20G in right hand. VS stable. BP-130/72 HR-80 Respirations -18. Cap refills are stable. Pt is alert and oriented x4.

## 2017-03-12 NOTE — ED Provider Notes (Signed)
[facility] Emergency Department Provider Note   ____________________________________________    I have reviewed the triage vital signs and the nursing notes.   HISTORY  Chief Complaint Wrist Pain     HPI Jennifer Larsen is a 81 y.o. female who presents with complaints of left wrist pain status post a fall. Patient reports she was dancing with her granddaughter in the kitchen and fell backwards and tried to catch herself with her left hand.  No head injury. No abdominal pain. No back pain. Mild hip pain but full range of motion. She denies other injuries. This happened approximately 30 minutes prior to arrival. Complains of severe pain in her left wrist which is sharp in nature and worse with any movement of her hand   Past Medical History:  Diagnosis Date  . GERD (gastroesophageal reflux disease)   . Hypertension   . Thyroid disease     There are no active problems to display for this patient.   Past Surgical History:  Procedure Laterality Date  . BREAST CYST ASPIRATION Left 03/24/2014    Prior to Admission medications   Medication Sig Start Date End Date Taking? Authorizing Provider  amLODipine (NORVASC) 2.5 MG tablet Take 1 tablet by mouth daily. 10/24/16   [provider]  aspirin 81 MG chewable tablet Chew 81 mg by mouth once.    [provider]  calcium-vitamin D (SM CALCIUM 500/VITAMIN D3) 500-400 MG-UNIT tablet Take 1 tablet by mouth daily.    [provider]  Coenzyme Q10 (COQ10 PO) Take 1 tablet by mouth daily.    [provider]  lansoprazole (PREVACID) 30 MG capsule Take 1 capsule by mouth daily. 09/29/16   [provider]  LUTEIN PO Take by mouth.    [provider]  Multiple Vitamin (MULTI-VITAMINS) TABS Take 1 tablet by mouth daily.    [provider]  oxyCODONE-acetaminophen (ROXICET) 5-325 MG tablet Take 1 tablet by mouth every 6 (six) hours as needed. 03/12/17  03/12/18  Lavonia Drafts, MD  SYNTHROID 100 MCG tablet Take 1 tablet by mouth daily. 10/24/16   [provider]     Allergies Cephalexin; Clarithromycin; and Ofloxacin  Family History  Problem Relation Age of Onset  . Breast cancer Mother 55  . Breast cancer Maternal Aunt 50    Social History Social History  Substance Use Topics  . Smoking status: Never Smoker  . Smokeless tobacco: Never Used  . Alcohol use No    Review of Systems  Constitutional: No fever/chills Eyes: No visual changes.  ENT: No sore throat. Cardiovascular: Denies chest pain. Respiratory: Denies shortness of breath. Gastrointestinal: No abdominal pain.  No nausea, no vomiting.   Genitourinary: Negative for dysuria. Musculoskeletal: Negative for back pain. Wrist pain as above Skin: Negative for rash. No laceration Neurological: Negative for headaches or weakness   ____________________________________________   PHYSICAL EXAM:  VITAL SIGNS: ED Triage Vitals  Enc Vitals Group     BP 03/12/17 1650 130/67     Pulse Rate 03/12/17 1650 (!) 57     Resp 03/12/17 1700 12     Temp 03/12/17 1650 98.3 F (36.8 C)     Temp Source 03/12/17 1650 Oral     SpO2 03/12/17 1650 97 %     Weight 03/12/17 1651 120 lb (54.4 kg)     Height 03/12/17 1651 5\' 5"  (1.651 m)     Head Circumference --      Peak Flow --  Pain Score --      Pain Loc --      Pain Edu? --      Excl. in [place]? --     Constitutional: Alert and oriented. No acute distress. Pleasant and interactive Eyes: Conjunctivae are normal.  Head: Atraumatic. Nose: No Swelling Mouth/Throat: Mucous membranes are moist.   Neck:  Painless ROM, no vertebral tenderness to palpation Cardiovascular: Normal rate, regular rhythm.  Good peripheral circulation. Respiratory: Normal respiratory effort.  No retractions.  Gastrointestinal: Soft and nontender. No distention.   Genitourinary: deferred Musculoskeletal: No lower extremity tenderness nor  edema.  Warm and well perfused. Obvious deformity to left wrist, significant swelling overlying the lateral portion of the radius, normal range of motion of fingers but with significant pain. Normal cap refill Neurologic:  Normal speech and language. No gross focal neurologic deficits are appreciated.  Skin:  Skin is warm, dry and intact.  Psychiatric: Mood and affect are normal. Speech and behavior are normal.  ____________________________________________   LABS (all labs ordered are listed, but only abnormal results are displayed)  Labs Reviewed - No data to display ____________________________________________  EKG None ____________________________________________  RADIOLOGY  Comminuted displaced angulated intra-articular fracture of the distal left radial metaphysis ____________________________________________   PROCEDURES  Procedure(s) performed:yes  SPLINT APPLICATION Date/Time: 6:78 PM Authorized by: Lavonia Drafts Consent: Verbal consent obtained. Risks and benefits: risks, benefits and alternatives were discussed Consent given by: patient Splint applied by: me Location details: left wrist Splint type: sugartong Supplies used: orthoglass, ace Post-procedure: The splinted body part was neurovascularly unchanged following the procedure. Patient tolerance: Patient tolerated the procedure well with no immediate complications.      Critical Care performed: No ____________________________________________   INITIAL IMPRESSION / ASSESSMENT AND PLAN / ED COURSE  Pertinent labs & imaging results that were available during my care of the patient were reviewed by me and considered in my medical decision making (see chart for details).  Patient presents with isolated left wrist injury, clear deformity on exam. No evidence of vascular injury or deficit. X-ray confirms radial fracture, discussed with Dr. Marry Guan of orthopedics who recommends sugar tong splint and he will  see the patient in his office tomorrow, no reduction given instability.      ____________________________________________   FINAL CLINICAL IMPRESSION(S) / ED DIAGNOSES  Final diagnoses:  Other closed intra-articular fracture of distal end of left radius, initial encounter      NEW MEDICATIONS STARTED DURING THIS VISIT:  New Prescriptions   OXYCODONE-ACETAMINOPHEN (ROXICET) 5-325 MG TABLET    Take 1 tablet by mouth every 6 (six) hours as needed.     Note:  This document was prepared using Dragon voice recognition software and may include unintentional dictation errors.    Lavonia Drafts, MD 03/12/17 [id number]

## 2017-03-17 ENCOUNTER — Encounter: Payer: Self-pay | Admitting: *Deleted

## 2017-03-17 ENCOUNTER — Encounter
Admission: RE | Disposition: A | Discharge status: Home / Self Care | Payer: Self-pay | Source: Ambulatory Visit | Attending: Orthopedic Surgery

## 2017-03-17 ENCOUNTER — Ambulatory Visit: Payer: Medicare Other

## 2017-03-17 ENCOUNTER — Ambulatory Visit: Payer: Medicare Other | Admitting: Anesthesiology

## 2017-03-17 ENCOUNTER — Ambulatory Visit
Admission: RE | Admit: 2017-03-17 | Discharge: 2017-03-18 | Disposition: A | Discharge status: Home / Self Care | Payer: Medicare Other | Source: Ambulatory Visit | Attending: Orthopedic Surgery | Admitting: Orthopedic Surgery

## 2017-03-17 DIAGNOSIS — Z79899 Other long term (current) drug therapy: Secondary | ICD-10-CM | POA: Diagnosis not present

## 2017-03-17 DIAGNOSIS — K219 Gastro-esophageal reflux disease without esophagitis: Secondary | ICD-10-CM | POA: Diagnosis not present

## 2017-03-17 DIAGNOSIS — Z8249 Family history of ischemic heart disease and other diseases of the circulatory system: Secondary | ICD-10-CM | POA: Diagnosis not present

## 2017-03-17 DIAGNOSIS — S52572A Other intraarticular fracture of lower end of left radius, initial encounter for closed fracture: Secondary | ICD-10-CM | POA: Diagnosis present

## 2017-03-17 DIAGNOSIS — I10 Essential (primary) hypertension: Secondary | ICD-10-CM | POA: Diagnosis not present

## 2017-03-17 DIAGNOSIS — Z8781 Personal history of (healed) traumatic fracture: Secondary | ICD-10-CM

## 2017-03-17 DIAGNOSIS — Z833 Family history of diabetes mellitus: Secondary | ICD-10-CM | POA: Diagnosis not present

## 2017-03-17 DIAGNOSIS — Z9889 Other specified postprocedural states: Secondary | ICD-10-CM | POA: Diagnosis not present

## 2017-03-17 DIAGNOSIS — Z7982 Long term (current) use of aspirin: Secondary | ICD-10-CM | POA: Diagnosis not present

## 2017-03-17 DIAGNOSIS — S52509A Unspecified fracture of the lower end of unspecified radius, initial encounter for closed fracture: Secondary | ICD-10-CM | POA: Diagnosis present

## 2017-03-17 DIAGNOSIS — Z7951 Long term (current) use of inhaled steroids: Secondary | ICD-10-CM | POA: Insufficient documentation

## 2017-03-17 DIAGNOSIS — W19XXXA Unspecified fall, initial encounter: Secondary | ICD-10-CM | POA: Diagnosis not present

## 2017-03-17 DIAGNOSIS — Z881 Allergy status to other antibiotic agents status: Secondary | ICD-10-CM | POA: Diagnosis not present

## 2017-03-17 DIAGNOSIS — Z803 Family history of malignant neoplasm of breast: Secondary | ICD-10-CM | POA: Diagnosis not present

## 2017-03-17 DIAGNOSIS — Z841 Family history of disorders of kidney and ureter: Secondary | ICD-10-CM | POA: Insufficient documentation

## 2017-03-17 DIAGNOSIS — Z90710 Acquired absence of both cervix and uterus: Secondary | ICD-10-CM | POA: Diagnosis not present

## 2017-03-17 DIAGNOSIS — Z79891 Long term (current) use of opiate analgesic: Secondary | ICD-10-CM | POA: Insufficient documentation

## 2017-03-17 DIAGNOSIS — E039 Hypothyroidism, unspecified: Secondary | ICD-10-CM | POA: Diagnosis not present

## 2017-03-17 DIAGNOSIS — I252 Old myocardial infarction: Secondary | ICD-10-CM | POA: Diagnosis not present

## 2017-03-17 HISTORY — DX: Cardiac arrhythmia, unspecified: I49.9

## 2017-03-17 HISTORY — DX: Unspecified hearing loss, unspecified ear: H91.90

## 2017-03-17 HISTORY — DX: Acute myocardial infarction, unspecified: I21.9

## 2017-03-17 HISTORY — PX: OPEN REDUCTION INTERNAL FIXATION (ORIF) DISTAL RADIAL FRACTURE: SHX5989

## 2017-03-17 HISTORY — DX: Hypothyroidism, unspecified: E03.9

## 2017-03-17 SURGERY — OPEN REDUCTION INTERNAL FIXATION (ORIF) DISTAL RADIUS FRACTURE
Anesthesia: General | Site: Arm Lower | Laterality: Left | Wound class: Clean

## 2017-03-17 MED ORDER — FENTANYL CITRATE (PF) 100 MCG/2ML IJ SOLN
25.0000 ug | INTRAMUSCULAR | Status: DC | PRN
  Administered 2017-03-17 (×2): 50 ug via INTRAVENOUS

## 2017-03-17 MED ORDER — VITAMIN D 1000 UNITS PO TABS
1000.0000 [IU] | ORAL_TABLET | Freq: Every day | ORAL | Status: DC
Start: 2017-03-18 — End: 2017-03-18
  Administered 2017-03-18: 1000 [IU] via ORAL
  Filled 2017-03-17: qty 1

## 2017-03-17 MED ORDER — LEVOTHYROXINE SODIUM 100 MCG PO TABS
100.0000 ug | ORAL_TABLET | Freq: Every day | ORAL | Status: DC
  Administered 2017-03-18: 100 ug via ORAL
  Filled 2017-03-17: qty 1

## 2017-03-17 MED ORDER — FAMOTIDINE 20 MG PO TABS
ORAL_TABLET | ORAL | Status: AC
  Filled 2017-03-17: qty 1

## 2017-03-17 MED ORDER — FENTANYL CITRATE (PF) 100 MCG/2ML IJ SOLN
INTRAMUSCULAR | Status: AC
  Administered 2017-03-17: 50 ug via INTRAVENOUS
  Filled 2017-03-17: qty 2

## 2017-03-17 MED ORDER — OXYCODONE HCL 5 MG PO TABS: 5.0000 mg | ORAL_TABLET | Freq: Once | ORAL | Status: DC | PRN

## 2017-03-17 MED ORDER — ONDANSETRON HCL 4 MG PO TABS: 4.0000 mg | ORAL_TABLET | Freq: Four times a day (QID) | ORAL | Status: DC | PRN

## 2017-03-17 MED ORDER — FENTANYL CITRATE (PF) 100 MCG/2ML IJ SOLN
INTRAMUSCULAR | Status: AC
  Filled 2017-03-17: qty 2

## 2017-03-17 MED ORDER — NEOMYCIN-POLYMYXIN B GU 40-200000 IR SOLN
Status: AC
  Filled 2017-03-17: qty 2

## 2017-03-17 MED ORDER — ACETAMINOPHEN 10 MG/ML IV SOLN
INTRAVENOUS | Status: AC
  Filled 2017-03-17: qty 100

## 2017-03-17 MED ORDER — PROPOFOL 10 MG/ML IV BOLUS
INTRAVENOUS | Status: DC | PRN
  Administered 2017-03-17: 140 mg via INTRAVENOUS

## 2017-03-17 MED ORDER — ASPIRIN 81 MG PO CHEW
40.5000 mg | CHEWABLE_TABLET | Freq: Every day | ORAL | Status: DC
  Administered 2017-03-18: 40.5 mg via ORAL
  Filled 2017-03-17: qty 1

## 2017-03-17 MED ORDER — OXYCODONE HCL 5 MG/5ML PO SOLN: 5.0000 mg | Freq: Once | ORAL | Status: DC | PRN

## 2017-03-17 MED ORDER — SUCCINYLCHOLINE CHLORIDE 20 MG/ML IJ SOLN
INTRAMUSCULAR | Status: DC | PRN
  Administered 2017-03-17: 100 mg via INTRAVENOUS

## 2017-03-17 MED ORDER — METOCLOPRAMIDE HCL 10 MG PO TABS: 5.0000 mg | ORAL_TABLET | Freq: Three times a day (TID) | ORAL | Status: DC | PRN

## 2017-03-17 MED ORDER — PANTOPRAZOLE SODIUM 40 MG PO TBEC
40.0000 mg | DELAYED_RELEASE_TABLET | Freq: Every day | ORAL | Status: DC
  Administered 2017-03-18: 40 mg via ORAL
  Filled 2017-03-17: qty 1

## 2017-03-17 MED ORDER — SODIUM CHLORIDE 0.9 % IV SOLN
INTRAVENOUS | Status: DC
  Administered 2017-03-17: 23:00:00 via INTRAVENOUS

## 2017-03-17 MED ORDER — MORPHINE SULFATE (PF) 2 MG/ML IV SOLN
1.0000 mg | INTRAVENOUS | Status: DC | PRN
Start: 2017-03-17 — End: 2017-03-18
  Administered 2017-03-18: 1 mg via INTRAVENOUS
  Filled 2017-03-17: qty 1

## 2017-03-17 MED ORDER — HYDROCODONE-ACETAMINOPHEN 5-325 MG PO TABS: 1.0000 | ORAL_TABLET | ORAL | Status: DC | PRN

## 2017-03-17 MED ORDER — OXYCODONE HCL 5 MG PO TABS
5.0000 mg | ORAL_TABLET | ORAL | Status: DC | PRN
  Administered 2017-03-17 – 2017-03-18 (×2): 5 mg via ORAL
  Administered 2017-03-18: 10 mg via ORAL
  Filled 2017-03-17: qty 2
  Filled 2017-03-17 (×2): qty 1

## 2017-03-17 MED ORDER — MAGNESIUM 250 MG PO TABS: 250.0000 mg | ORAL_TABLET | Freq: Every day | ORAL | Status: DC

## 2017-03-17 MED ORDER — CEFAZOLIN SODIUM-DEXTROSE 2-4 GM/100ML-% IV SOLN
2.0000 g | Freq: Once | INTRAVENOUS | Status: AC
  Administered 2017-03-17: 2 g via INTRAVENOUS

## 2017-03-17 MED ORDER — GLUCOSAMINE HCL 1000 MG PO TABS: 1000.0000 mg | ORAL_TABLET | Freq: Every day | ORAL | Status: DC

## 2017-03-17 MED ORDER — MAGNESIUM OXIDE 400 (241.3 MG) MG PO TABS
200.0000 mg | ORAL_TABLET | Freq: Every day | ORAL | Status: DC
  Administered 2017-03-18: 200 mg via ORAL
  Filled 2017-03-17: qty 1

## 2017-03-17 MED ORDER — KETOROLAC TROMETHAMINE 30 MG/ML IJ SOLN
INTRAMUSCULAR | Status: DC | PRN
  Administered 2017-03-17: 30 mg via INTRAVENOUS

## 2017-03-17 MED ORDER — LACTATED RINGERS IV SOLN
INTRAVENOUS | Status: DC | PRN
  Administered 2017-03-17: 21:00:00 via INTRAVENOUS

## 2017-03-17 MED ORDER — BIOTIN 10000 MCG PO TBDP: 5000.0000 ug | ORAL_TABLET | Freq: Every day | ORAL | Status: DC

## 2017-03-17 MED ORDER — FENTANYL CITRATE (PF) 100 MCG/2ML IJ SOLN
INTRAMUSCULAR | Status: DC | PRN
  Administered 2017-03-17 (×2): 50 ug via INTRAVENOUS

## 2017-03-17 MED ORDER — EPHEDRINE SULFATE 50 MG/ML IJ SOLN
INTRAMUSCULAR | Status: DC | PRN
  Administered 2017-03-17: 20 mg via INTRAVENOUS

## 2017-03-17 MED ORDER — ACETAMINOPHEN 10 MG/ML IV SOLN
INTRAVENOUS | Status: DC | PRN
  Administered 2017-03-17: 1000 mg via INTRAVENOUS

## 2017-03-17 MED ORDER — LACTATED RINGERS IV SOLN
INTRAVENOUS | Status: DC
  Administered 2017-03-17: 100 mL/h via INTRAVENOUS

## 2017-03-17 MED ORDER — NEOMYCIN-POLYMYXIN B GU 40-200000 IR SOLN
Status: DC | PRN
  Administered 2017-03-17: 2 mL

## 2017-03-17 MED ORDER — VITAMIN B-12 100 MCG PO TABS
250.0000 ug | ORAL_TABLET | Freq: Every day | ORAL | Status: DC
  Administered 2017-03-18: 250 ug via ORAL
  Filled 2017-03-17: qty 3

## 2017-03-17 MED ORDER — CEFAZOLIN SODIUM-DEXTROSE 2-4 GM/100ML-% IV SOLN
INTRAVENOUS | Status: AC
Start: 2017-03-17 — End: 2017-03-18
  Filled 2017-03-17: qty 100

## 2017-03-17 MED ORDER — TRETINOIN 0.025 % EX CREA: 1.0000 "application " | TOPICAL_CREAM | Freq: Every day | CUTANEOUS | Status: DC

## 2017-03-17 MED ORDER — ONDANSETRON HCL 4 MG/2ML IJ SOLN: 4.0000 mg | Freq: Four times a day (QID) | INTRAMUSCULAR | Status: DC | PRN

## 2017-03-17 MED ORDER — ONDANSETRON HCL 4 MG/2ML IJ SOLN
INTRAMUSCULAR | Status: DC | PRN
  Administered 2017-03-17: 4 mg via INTRAVENOUS

## 2017-03-17 MED ORDER — AMLODIPINE BESYLATE 5 MG PO TABS
2.5000 mg | ORAL_TABLET | Freq: Every day | ORAL | Status: DC
  Administered 2017-03-18: 2.5 mg via ORAL
  Filled 2017-03-17: qty 1

## 2017-03-17 MED ORDER — VITAMIN B-1 100 MG PO TABS
100.0000 mg | ORAL_TABLET | Freq: Every day | ORAL | Status: DC
  Administered 2017-03-18: 100 mg via ORAL
  Filled 2017-03-17: qty 1

## 2017-03-17 MED ORDER — METOCLOPRAMIDE HCL 5 MG/ML IJ SOLN: 5.0000 mg | Freq: Three times a day (TID) | INTRAMUSCULAR | Status: DC | PRN

## 2017-03-17 MED ORDER — ADULT MULTIVITAMIN W/MINERALS CH
1.0000 | ORAL_TABLET | Freq: Every day | ORAL | Status: DC
  Administered 2017-03-18: 1 via ORAL
  Filled 2017-03-17: qty 1

## 2017-03-17 SURGICAL SUPPLY — 41 items
BANDAGE ACE 4X5 VEL STRL LF (GAUZE/BANDAGES/DRESSINGS) ×3 IMPLANT
BIT DRILL 2 FAST STEP (BIT) ×3 IMPLANT
BIT DRILL 2.5X4 QC (BIT) ×3 IMPLANT
CANISTER SUCT 1200ML W/VALVE (MISCELLANEOUS) ×3 IMPLANT
CHLORAPREP W/TINT 26ML (MISCELLANEOUS) ×3 IMPLANT
CUFF TOURN 18 STER (MISCELLANEOUS) ×3 IMPLANT
DRAPE FLUOR MINI C-ARM 54X84 (DRAPES) ×3 IMPLANT
ELECT REM PT RETURN 9FT ADLT (ELECTROSURGICAL) ×3
ELECTRODE REM PT RTRN 9FT ADLT (ELECTROSURGICAL) ×1 IMPLANT
GAUZE PETRO XEROFOAM 1X8 (MISCELLANEOUS) ×3 IMPLANT
GAUZE SPONGE 4X4 12PLY STRL (GAUZE/BANDAGES/DRESSINGS) ×3 IMPLANT
GLOVE SURG SYN 9.0  PF PI (GLOVE) ×2
GLOVE SURG SYN 9.0 PF PI (GLOVE) ×1 IMPLANT
GOWN SRG 2XL LVL 4 RGLN SLV (GOWNS) ×1 IMPLANT
GOWN STRL NON-REIN 2XL LVL4 (GOWNS) ×2
GOWN STRL REUS W/ TWL LRG LVL3 (GOWN DISPOSABLE) ×1 IMPLANT
GOWN STRL REUS W/TWL LRG LVL3 (GOWN DISPOSABLE) ×2
K-WIRE 1.6 (WIRE) ×2
K-WIRE FX5X1.6XNS BN SS (WIRE) ×1
KIT RM TURNOVER STRD PROC AR (KITS) ×3 IMPLANT
KWIRE FX5X1.6XNS BN SS (WIRE) ×1 IMPLANT
NEEDLE FILTER BLUNT 18X 1/2SAF (NEEDLE) ×2
NEEDLE FILTER BLUNT 18X1 1/2 (NEEDLE) ×1 IMPLANT
NS IRRIG 500ML POUR BTL (IV SOLUTION) ×3 IMPLANT
PACK EXTREMITY ARMC (MISCELLANEOUS) ×3 IMPLANT
PAD CAST CTTN 4X4 STRL (SOFTGOODS) ×2 IMPLANT
PADDING CAST COTTON 4X4 STRL (SOFTGOODS) ×4
PEG SUBCHONDRAL SMOOTH 2.0X16 (Peg) ×3 IMPLANT
PEG SUBCHONDRAL SMOOTH 2.0X18 (Peg) ×3 IMPLANT
PEG SUBCHONDRAL SMOOTH 2.0X24 (Peg) ×12 IMPLANT
PEG SUBCHONDRAL SMOOTH 2.0X26 (Peg) ×6 IMPLANT
PLATE SHORT 21.6X48.9 NRRW LT (Plate) ×3 IMPLANT
SCREW CORT 3.5X10 LNG (Screw) ×9 IMPLANT
SCREW MULTI DIRECT 18MM (Screw) ×3 IMPLANT
SPLINT CAST 1 STEP 3X12 (MISCELLANEOUS) ×3 IMPLANT
STOCKINETTE STRL 4IN 9604848 (GAUZE/BANDAGES/DRESSINGS) ×3 IMPLANT
SUT ETHILON 4-0 (SUTURE) ×2
SUT ETHILON 4-0 FS2 18XMFL BLK (SUTURE) ×1
SUT VICRYL 3-0 27IN (SUTURE) ×3 IMPLANT
SUTURE ETHLN 4-0 FS2 18XMF BLK (SUTURE) ×1 IMPLANT
SYR 3ML LL SCALE MARK (SYRINGE) ×3 IMPLANT

## 2017-03-17 NOTE — Transfer of Care (Signed)
Immediate Anesthesia Transfer of Care Note  Patient: Jennifer Larsen  Procedure(s) Performed: Procedure(s): OPEN REDUCTION INTERNAL FIXATION (ORIF) DISTAL RADIAL FRACTURE (Left)  Patient Location: PACU  Anesthesia Type:General  Level of Consciousness: awake, alert  and oriented  Airway & Oxygen Therapy: Patient Spontanous Breathing  Post-op Assessment: Report given to RN  Post vital signs: Reviewed and stable  Last Vitals:  Vitals:   03/17/17 1512 03/17/17 2155  BP: 135/70 (!) 153/65  Pulse: 74 98  Resp: 15 19  Temp: 36.4 C (!) 36 C    Last Pain:  Vitals:   03/17/17 1512  TempSrc: Tympanic  PainSc: 3       Patients Stated Pain Goal: 0 (16/01/09 3235)  Complications: No apparent anesthesia complications

## 2017-03-17 NOTE — Anesthesia Postprocedure Evaluation (Signed)
Anesthesia Post Note  Patient: Jennifer Larsen  Procedure(s) Performed: Procedure(s) (LRB): OPEN REDUCTION INTERNAL FIXATION (ORIF) DISTAL RADIAL FRACTURE (Left)  Patient location during evaluation: PACU Anesthesia Type: General Level of consciousness: awake and alert Pain management: pain level controlled Vital Signs Assessment: post-procedure vital signs reviewed and stable Respiratory status: spontaneous breathing, nonlabored ventilation, respiratory function stable and patient connected to nasal cannula oxygen Cardiovascular status: blood pressure returned to baseline and stable Postop Assessment: no signs of nausea or vomiting Anesthetic complications: no     Last Vitals:  Vitals:   03/17/17 2237 03/17/17 2254  BP: (!) 150/65 (!) 141/61  Pulse: 71 (!) 58  Resp: 14 19  Temp:  36.4 C    Last Pain:  Vitals:   03/17/17 2254  TempSrc: Oral  PainSc:                  [name]

## 2017-03-17 NOTE — OR Nursing (Signed)
Left arm elevated on pillow to keep wrist above level of heart. Fingers on left arm are warm to touch, slightly swollen and have good perfusion of nail beds.

## 2017-03-17 NOTE — H&P (Signed)
Reviewed paper H+P, will be scanned into chart. No changes noted. Patient examined  

## 2017-03-17 NOTE — Anesthesia Preprocedure Evaluation (Addendum)
Anesthesia Evaluation  Patient identified by MRN, date of birth, ID band Patient awake    Reviewed: Allergy & Precautions, H&P , NPO status , Patient's Chart, lab work & pertinent test results  History of Anesthesia Complications Negative for: history of anesthetic complications  Airway Mallampati: III  TM Distance: <3 FB Neck ROM: limited    Dental  (+) Poor Dentition, Chipped, Upper Dentures, Missing, Implants, Edentulous Upper   Pulmonary neg pulmonary ROS, neg shortness of breath,    Pulmonary exam normal breath sounds clear to auscultation       Cardiovascular Exercise Tolerance: Good hypertension, (-) angina(-) Past MI and (-) DOE Normal cardiovascular exam+ dysrhythmias  Rhythm:regular Rate:Normal     Neuro/Psych negative neurological ROS  negative psych ROS   GI/Hepatic Neg liver ROS, GERD  Controlled and Medicated,  Endo/Other  Hypothyroidism   Renal/GU      Musculoskeletal   Abdominal   Peds  Hematology negative hematology ROS (+)   Anesthesia Other Findings Past Medical History: 03/2017: Dysrhythmia     Comment: left bundle branch block No date: GERD (gastroesophageal reflux disease) No date: Hard of hearing No date: Hypertension No date: Hypothyroidism No date: Myocardial infarction ([facility])     Comment: ekg shows left bundle branch block per dr.               Ubaldo Glassing, NO heart attack No date: Thyroid disease  Past Surgical History: No date: ABDOMINAL HYSTERECTOMY 03/24/2014: BREAST CYST ASPIRATION Left No date: BUNIONECTOMY No date: DENTAL SURGERY     Comment: implants on lower teeth  BMI    Body Mass Index:  19.97 kg/m      Reproductive/Obstetrics negative OB ROS                            Anesthesia Physical Anesthesia Plan  ASA: III  Anesthesia Plan: General ETT   Post-op Pain Management:    Induction: Intravenous  Airway Management Planned: Oral  ETT  Additional Equipment:   Intra-op Plan:   Post-operative Plan: Extubation in OR  Informed Consent: I have reviewed the patients History and Physical, chart, labs and discussed the procedure including the risks, benefits and alternatives for the proposed anesthesia with the patient or authorized representative who has indicated his/her understanding and acceptance.   Dental Advisory Given  Plan Discussed with: Anesthesiologist, CRNA and Surgeon  Anesthesia Plan Comments: (Patient consented for risks of anesthesia including but not limited to:  - adverse reactions to medications - damage to teeth, lips or other oral mucosa - sore throat or hoarseness - Damage to heart, brain, lungs or loss of life  Patient voiced understanding.)        Anesthesia Quick Evaluation

## 2017-03-17 NOTE — Anesthesia Procedure Notes (Signed)
Procedure Name: Intubation Date/Time: 03/17/2017 8:55 PM Performed by: [id number], Addaline Peplinski Pre-anesthesia Checklist: Patient identified, Emergency Drugs available, Patient being monitored, Timeout performed and Suction available Patient Re-evaluated:Patient Re-evaluated prior to inductionOxygen Delivery Method: Circle system utilized Preoxygenation: Pre-oxygenation with 100% oxygen Intubation Type: IV induction Laryngoscope Size: Mac and 3 Grade View: Grade I Tube type: Oral Tube size: 7.5 mm Placement Confirmation: ETT inserted through vocal cords under direct vision,  positive ETCO2,  CO2 detector and breath sounds checked- equal and bilateral Secured at: 22 cm Tube secured with: Tape

## 2017-03-17 NOTE — Anesthesia Post-op Follow-up Note (Cosign Needed)
Anesthesia QCDR form completed.        

## 2017-03-17 NOTE — Op Note (Signed)
03/17/2017  9:52 PM  PATIENT:  Jennifer Larsen  81 y.o. female  PRE-OPERATIVE DIAGNOSIS:  FRACTURE LEFT DISTAL RADIUS, intra-articular with 3 distal fragments  POST-OPERATIVE DIAGNOSIS:  FRACTURE LEFT DISTAL RADIUS same  PROCEDURE:  Procedure(s): OPEN REDUCTION INTERNAL FIXATION (ORIF) DISTAL RADIAL FRACTURE (Left)  SURGEON: Laurene Footman, MD  ASSISTANTS: None  ANESTHESIA:   general  EBL:  Total I/O In: -  Out: 10 [Blood:10]  BLOOD ADMINISTERED:none  DRAINS: none   LOCAL MEDICATIONS USED:  NONE  SPECIMEN:  No Specimen  DISPOSITION OF SPECIMEN:  N/A  COUNTS:  YES  TOURNIQUET:   Total Tourniquet Time Documented: Upper Arm (Left) - 23 minutes Total: Upper Arm (Left) - 23 minutes   IMPLANTS: [employer] short narrow DVR plate with multiple pegs and screws  DICTATION: .Dragon Dictation patient was brought the operating room and after adequate anesthesia was obtained the left arm was prepped and draped in usual sterile fashion with a tourniquet by the upper arm. After patient identification and timeout procedures were completed fingertrap traction was applied and 10 pounds of traction applied off the edge of the table. I incision was made over the FCR tendon and the tendon sheath incised with the tendon retracted radially to protect the radial artery and associated veins. The deep muscle was then elevated and retractor placed to her exposure of the distal shaft and distal fragments. There were 2 large intra-articular fragments volarly and one large dorsal fragment with traction having been applied and gentle reduction with volar flexion the volar tilt was not restored. Because of this a distal first technique was utilized with the plate applied to the distal fragments and multiple pegs placed as well as multidirectional screw. When this was in the appropriate position with hardware in place the plate was brought to the shaft and 310 mm cortical screws were  placed with essentially anatomic alignment of the radius. Traction was released and under fluoroscopic examination the fracture was stable. The wound was thoroughly irrigated and tourniquet let down. Hemostasis electrocautery. 3-0 Vicryl was used subcutaneously followed by 4-0  nylon for the skin. Xeroform 4 x 4 web roll and a volar splint were applied followed by an Ace wrap  PLAN OF CARE: Admit for overnight observation  PATIENT DISPOSITION:  PACU - hemodynamically stable.

## 2017-03-17 NOTE — Progress Notes (Signed)
PHARMACIST - PHYSICIAN ORDER COMMUNICATION  CONCERNING: P&T Medication Policy on Herbal Medications  DESCRIPTION:  This patient's order for:  Glucosamine, biotin  has been noted.  This product(s) is classified as an "herbal" or natural product. Due to a lack of definitive safety studies or FDA approval, nonstandard manufacturing practices, plus the potential risk of unknown drug-drug interactions while on inpatient medications, the Pharmacy and Therapeutics Committee does not permit the use of "herbal" or natural products of this type within [facility]   ACTION TAKEN: The pharmacy department is unable to verify this order at this time. Please reevaluate patient's clinical condition at discharge and address if the herbal or natural product(s) should be resumed at that time.

## 2017-03-18 ENCOUNTER — Encounter: Payer: Self-pay | Admitting: Orthopedic Surgery

## 2017-03-18 DIAGNOSIS — S52572A Other intraarticular fracture of lower end of left radius, initial encounter for closed fracture: Secondary | ICD-10-CM | POA: Diagnosis not present

## 2017-03-18 MED ORDER — OXYCODONE HCL 5 MG PO TABS: 5.0000 mg | ORAL_TABLET | ORAL | 0 refills | Status: AC | PRN

## 2017-03-18 NOTE — Discharge Instructions (Signed)
Diet: As you were doing prior to hospitalization   Shower:  May shower but keep the wounds dry, use an occlusive plastic wrap, NO SOAKING IN TUB.  If the bandage gets wet, change with a clean dry gauze.  Dressing:  Return to the office in 2 days for a dressing change.    To prevent constipation: you may use a stool softener such as -  Colace (over the counter) 100 mg by mouth twice a day  Drink plenty of fluids (prune juice may be helpful) and high fiber foods Miralax (over the counter) for constipation as needed.    Itching:  If you experience itching with your medications, try taking only a single pain pill, or even half a pain pill at a time.  You may take up to 10 pain pills per day, and you can also use benadryl over the counter for itching or also to help with sleep.   Precautions:  If you experience chest pain or shortness of breath - call 911 immediately for transfer to the hospital emergency department!!  If you develop a fever greater that 101 F, purulent drainage from wound, increased redness or drainage from wound, or calf pain-Call [facility]                                              Follow- Up Appointment:  Please call for an appointment to be seen in 2 days at [facility]

## 2017-03-18 NOTE — Discharge Summary (Signed)
Physician Discharge Summary  Patient ID: Jennifer Larsen MRN: [id number] DOB/AGE: [id number] y.o.  Admit date: 03/17/2017 Discharge date: 03/18/2017  Admission Diagnoses:  [place]   Discharge Diagnoses: Patient Active Problem List   Diagnosis Date Noted  . Distal radius fracture 03/17/2017    Past Medical History:  Diagnosis Date  . Dysrhythmia 03/2017   left bundle branch block  . GERD (gastroesophageal reflux disease)   . Hard of hearing   . Hypertension   . Hypothyroidism   . Myocardial infarction ([place])    ekg shows left bundle branch block per dr. Ubaldo Glassing, NO heart attack  . Thyroid disease      Transfusion: none   Consultants (if any):   Discharged Condition: Improved  Hospital Course: Jennifer Larsen is an 81 y.o. female who was admitted 03/17/2017 with a diagnosis of Left distal radius fracture and went to the operating room on 03/17/2017 and underwent the above named procedures.    Surgeries: Procedure(s): OPEN REDUCTION INTERNAL FIXATION (ORIF) DISTAL RADIAL FRACTURE on 03/17/2017 Patient tolerated the surgery well. Taken to PACU where she was stabilized and then transferred to the orthopedic floor.  Patient was held overnight for observation. Pain well controlled. No complaints. On postop day 1 patient was doing well, vital signs are normal patient was stable and ready for discharge to home   Implants: [employer] short narrow DVR plate with multiple pegs and screws  She was given perioperative antibiotics:  Anti-infectives    Start     Dose/Rate Route Frequency Ordered Stop   03/17/17 1500  ceFAZolin (ANCEF) IVPB 2g/100 mL premix     2 g 200 mL/hr over 30 Minutes Intravenous  Once 03/17/17 1446 03/17/17 2100   03/17/17 1457  ceFAZolin (ANCEF) 2-4 GM/100ML-% IVPB    Comments:  Jennifer Larsen: cabinet override      03/17/17 1457 03/18/17 0259    .   She benefited maximally from the hospital stay and there  were no complications.    Recent vital signs:  Vitals:   03/18/17 0226 03/18/17 0302  BP:  (!) 110/52  Pulse: 83 66  Resp:  19  Temp:  98.4 F (36.9 C)    Recent laboratory studies:  Lab Results  Component Value Date   HGB 13.2 11/25/2016   HGB 13.3 07/13/2013   Lab Results  Component Value Date   WBC 5.8 11/25/2016   PLT 283 11/25/2016   Lab Results  Component Value Date   INR 0.91 11/25/2016   Lab Results  Component Value Date   NA 137 11/25/2016   K 3.7 11/25/2016   CL 103 11/25/2016   CO2 26 11/25/2016   BUN 13 11/25/2016   CREATININE 0.55 11/25/2016   GLUCOSE 111 (H) 11/25/2016    Discharge Medications:   Allergies as of 03/18/2017      Reactions   Cephalexin Rash   Clarithromycin Other (See Comments)   Unknown    Macrolides And Ketolides Other (See Comments)   Mouth ulcers   Ofloxacin Other (See Comments)   Unknown       Medication List    TAKE these medications   amLODipine 2.5 MG tablet Commonly known as:  NORVASC Take 1 tablet by mouth daily.   aspirin 81 MG chewable tablet Chew 40.5 mg by mouth daily. Takes 1/2 tab   Biotin 10000 MCG Tbdp Take 5,000 mcg by mouth daily.   Cholecalciferol 1000 units tablet Take 1,000 Units by mouth  daily.   COQ10 PO Take 1 tablet by mouth daily.   Glucosamine HCl 1000 MG Tabs Take 1,000 mg by mouth daily.   lansoprazole 30 MG capsule Commonly known as:  PREVACID Take 1 capsule by mouth daily.   Magnesium 250 MG Tabs Take 250 mg by mouth daily.   MULTI-VITAMINS Tabs Take 1 tablet by mouth daily.   OVER THE COUNTER MEDICATION Take 1 tablet by mouth daily. citrcal -400 mg vit d 500 IU   oxyCODONE 5 MG immediate release tablet Commonly known as:  Oxy IR/ROXICODONE Take 1-2 tablets (5-10 mg total) by mouth every 3 (three) hours as needed for breakthrough pain.   oxyCODONE-acetaminophen 5-325 MG tablet Commonly known as:  ROXICET Take 1 tablet by mouth every 6 (six) hours as needed. What  changed:  reasons to take this   SYNTHROID 100 MCG tablet Generic drug:  levothyroxine Take 1 tablet by mouth daily.   thiamine 100 MG tablet Commonly known as:  VITAMIN B-1 Take 100 mg by mouth daily.   tretinoin 0.025 % cream Commonly known as:  RETIN-A Apply 1 application topically at bedtime.   vitamin B-12 250 MCG tablet Commonly known as:  CYANOCOBALAMIN Take 250 mcg by mouth daily.       Diagnostic Studies: Dg Wrist 2 Views Left  Result Date: 03/17/2017 CLINICAL DATA:  Status post ORIF EXAM: LEFT WRIST - 2 VIEW COMPARISON:  03/12/2017 FINDINGS: Casting material obscures bone detail. Interval surgical plate and multiple screw fixation of comminuted intra-articular distal radius fracture with improved alignment. IMPRESSION: Interval surgical plate and screw fixation of comminuted intra-articular distal radius fracture with expected postsurgical changes Electronically Signed   By: Donavan Foil M.D.   On: 03/17/2017 22:32   Dg Wrist Complete Left  Result Date: 03/12/2017 CLINICAL DATA:  LEFT hand pain after falling at home in kitchen today, LEFT wrist swelling, initial encounter EXAM: LEFT WRIST - COMPLETE 3+ VIEW COMPARISON:  None FINDINGS: Osseous demineralization. Comminuted intra-articular fracture of the distal LEFT radial metaphysis with apex volar angulation and dorsal displacement. Acquired ulnar plus variance. Scattered degenerative changes at the radial border of the carpus. No additional fracture, dislocation, or bone destruction. IMPRESSION: Comminuted displaced and angulated intra-articular fracture of the distal LEFT radial metaphysis. Electronically Signed   By: Lavonia Dana M.D.   On: 03/12/2017 17:29   Dg Hand Complete Left  Result Date: 03/12/2017 CLINICAL DATA:  LEFT hand pain after falling at home in kitchen today, LEFT wrist swelling, initial encounter EXAM: LEFT HAND - COMPLETE 3+ VIEW COMPARISON:  None. FINDINGS: Osseous demineralization. Degenerative changes  at first [facility] joint and STT joint, less at IP joint thumb. Comminuted displaced and intra-articular fracture of the distal LEFT radial metaphysis with apex volar angulation. Acquired ulnar plus variance. No additional fracture, dislocation, or bone destruction. IMPRESSION: Comminuted displaced and angulated intra-articular fracture of distal LEFT radial metaphysis. Electronically Signed   By: Lavonia Dana M.D.   On: 03/12/2017 17:28    Disposition: 01-Home or Self Care       Signed: [place] 03/18/2017, 7:59 AM

## 2017-03-18 NOTE — Progress Notes (Signed)
Patient was discharged home with family. IV removed with cath intact. Reviewed meds, script, and last dose given. Reviewed discharge instructions with family in room, allowed time for questions.

## 2017-03-18 NOTE — Progress Notes (Signed)
Pt remaining alert and oriented. Medicated for pain through the night with good results. Able to void without difficulty this shift. Dressing remaining dry and intact. Pt able to move finger left hand warm to the touch.

## 2017-03-18 NOTE — Progress Notes (Signed)
   Subjective: 1 Day Post-Op Procedure(s) (LRB): OPEN REDUCTION INTERNAL FIXATION (ORIF) DISTAL RADIAL FRACTURE (Left) Patient reports pain as mild.   Patient is well, and has had no acute complaints or problems Denies any CP, SOB, ABD pain. We will continue therapy today.  Plan is to go Home after hospital stay.  Objective: Vital signs in last 24 hours: Temp:  [96.8 F (36 C)-98.6 F (37 C)] 98.4 F (36.9 C) (05/16 0302) Pulse Rate:  [58-98] 66 (05/16 0302) Resp:  [10-19] 19 (05/16 0302) BP: (110-153)/(52-72) 110/52 (05/16 0302) SpO2:  [93 %-100 %] 93 % (05/16 0302) Weight:  [54.4 kg (120 lb)-74.2 kg (163 lb 9.6 oz)] 74.2 kg (163 lb 9.6 oz) (05/16 0622)  Intake/Output from previous day: 05/15 0701 - 05/16 0700 In: 2382.5 [I.V.:2382.5] Out: 10 [Blood:10] Intake/Output this shift: No intake/output data recorded.  No results for input(s): HGB in the last 72 hours. No results for input(s): WBC, RBC, HCT, PLT in the last 72 hours. No results for input(s): NA, K, CL, CO2, BUN, CREATININE, GLUCOSE, CALCIUM in the last 72 hours. No results for input(s): LABPT, INR in the last 72 hours.  EXAM General - Patient is Alert, Appropriate and Oriented Extremity - Neurovascular intact Sensation intact distally Intact pulses distally No cellulitis present Compartment soft  Able to make a fist Dressing - dressing C/D/I and no drainage Motor Function - intact, moving foot and toes well on exam.   Past Medical History:  Diagnosis Date  . Dysrhythmia 03/2017   left bundle branch block  . GERD (gastroesophageal reflux disease)   . Hard of hearing   . Hypertension   . Hypothyroidism   . Myocardial infarction ([place])    ekg shows left bundle branch block per dr. Ubaldo Glassing, NO heart attack  . Thyroid disease     Assessment/Plan:   1 Day Post-Op Procedure(s) (LRB): OPEN REDUCTION INTERNAL FIXATION (ORIF) DISTAL RADIAL FRACTURE (Left) Active Problems:   Distal radius fracture  Estimated  body mass index is 27.22 kg/m as calculated from the following:   Height as of this encounter: 5\' 5"  (1.651 m).   Weight as of this encounter: 74.2 kg (163 lb 9.6 oz). Advance diet  Discharge home today Follow up with [facility] ortho Friday for dressing change  DVT Prophylaxis - Aspirin    T. Rachelle Hora, PA-C [place] 03/18/2017, 7:57 AM

## 2017-04-09 ENCOUNTER — Ambulatory Visit: Payer: Medicare Other | Attending: Student | Admitting: Occupational Therapy

## 2017-04-09 DIAGNOSIS — M25632 Stiffness of left wrist, not elsewhere classified: Secondary | ICD-10-CM | POA: Diagnosis present

## 2017-04-09 DIAGNOSIS — M25532 Pain in left wrist: Secondary | ICD-10-CM | POA: Diagnosis present

## 2017-04-09 DIAGNOSIS — M25642 Stiffness of left hand, not elsewhere classified: Secondary | ICD-10-CM | POA: Diagnosis present

## 2017-04-09 DIAGNOSIS — M6281 Muscle weakness (generalized): Secondary | ICD-10-CM

## 2017-04-09 DIAGNOSIS — R208 Other disturbances of skin sensation: Secondary | ICD-10-CM | POA: Diagnosis present

## 2017-04-09 DIAGNOSIS — L905 Scar conditions and fibrosis of skin: Secondary | ICD-10-CM

## 2017-04-09 NOTE — Therapy (Signed)
[facility] [address] [place], [address] Phone: [phone]   Fax:  [phone]  Occupational Therapy Evaluation  Patient Details  Name: Jennifer Larsen MRN: [id number] Date of Birth: 1936/07/02 Referring Provider: Rudene Christians  Encounter Date: 04/09/2017      OT End of Session - 04/09/17 1826    Visit Number 1   Number of Visits 12   Date for OT Re-Evaluation 05/21/17   OT Start Time 0093   OT Stop Time 1640   OT Time Calculation (min) 56 min   Activity Tolerance Patient tolerated treatment well   Behavior During Therapy [facility] for tasks assessed/performed      Past Medical History:  Diagnosis Date  . Dysrhythmia 03/2017   left bundle branch block  . GERD (gastroesophageal reflux disease)   . Hard of hearing   . Hypertension   . Hypothyroidism   . Myocardial infarction ([place])    ekg shows left bundle branch block per dr. Ubaldo Glassing, NO heart attack  . Thyroid disease     Past Surgical History:  Procedure Laterality Date  . ABDOMINAL HYSTERECTOMY    . BREAST CYST ASPIRATION Left 03/24/2014  . BUNIONECTOMY    . DENTAL SURGERY     implants on lower teeth  . OPEN REDUCTION INTERNAL FIXATION (ORIF) DISTAL RADIAL FRACTURE Left 03/17/2017   Procedure: OPEN REDUCTION INTERNAL FIXATION (ORIF) DISTAL RADIAL FRACTURE;  Surgeon: Hessie Knows, MD;  Location: [facility] ORS;  Service: Orthopedics;  Laterality: Left;    There were no vitals filed for this visit.      Subjective Assessment - 04/09/17 1553    Subjective  I fell in the kitchen broked my L wrist - I am L handed - had surgery 5/15 - but my wrist hurts , pain over my scar and then my thumb tingling, and in palm some what numb feeling - cannot use my L hand  in nearly anything    Patient Stated Goals I am L handed want to use it like before - and get numbness and pain better - lift , pull, open  objects, use it in bathing , dressing , yard work , ect    Currently in Pain? Yes   Pain Score 2    Pain Location Wrist   Pain Orientation Left   Pain Descriptors / Indicators Tingling;Aching   Pain Type Surgical pain   Pain Onset 1 to 4 weeks ago           [facility] OT Assessment - 04/09/17 0001      Assessment   Diagnosis R distal radius fx ORIF   Referring Provider Rudene Christians   Onset Date 03/17/17     Precautions   Required Braces or Orthoses --  Splint for wrist      Balance Screen   Has the patient fallen in the past 6 months Yes   How many times? 1   Has the patient had a decrease in activity level because of a fear of falling?  No   Is the patient reluctant to leave their home because of a fear of falling?  No     Home  Environment   Lives With Alone     Prior Function   Vocation Retired   Leisure L hand dominant - yard work, Psychiatric nurse, own house work , sewing , friends and great grand son , soduko, dominoes  with firends       AROM  Left Forearm Pronation 74 Degrees   Left Forearm Supination 85 Degrees   Right Wrist Extension 77 Degrees   Right Wrist Flexion 70 Degrees   Right Wrist Radial Deviation 18 Degrees   Left Wrist Extension 32 Degrees   Left Wrist Flexion 48 Degrees   Left Wrist Radial Deviation 10 Degrees   Left Wrist Ulnar Deviation 24 Degrees     Left Hand AROM   L Thumb Opposition to Index --  Opposition to base of 5th       eval done and fluidotherapy done prior to review of HEP  See hand out  AROM done in fluido - decrease pain and increase ROM  Pt to cont with splint wearing when up and about  POC discuss                    OT Education - 04/09/17 1825    Education provided Yes   Education Details findings of Eval and HEP    Person(s) Educated Patient   Methods Explanation;Demonstration;Verbal cues;Handout;Tactile cues   Comprehension Verbalized understanding;Verbal cues required;Tactile cues required;Returned demonstration          OT Short Term Goals - 04/09/17 2120       OT SHORT TERM GOAL #1   Title Pain in L wrist and hand improve with more than 15 points    Baseline pain on PRWHE at eval 25/50   Time 3   Period Weeks   Status New     OT SHORT TERM GOAL #2   Title Pt report improvement in sensory issues in L thumb and palm    Baseline Pt report some numbness in palm and tingling in thumb all the time    Time 2   Period Weeks   Status New     OT SHORT TERM GOAL #5   Title L wrist AROM improve in all planes for pt to improve using hand 50% in bathing and dressing    Baseline see flowsheet - 80% difficulty    Time 4   Period Weeks   Status New           OT Long Term Goals - 04/09/17 2123      OT LONG TERM GOAL #1   Title L wrist AROM improve to [facility] in all planes to be able to use hand in ADL's and house work without increase symptoms    Baseline see flowsheet - 39/50 function impairement    Time 5   Period Weeks   Status New     OT LONG TERM GOAL #2   Title Grip strength and prehension strenght improve to at least more than 50% compare to R hand to carry 5 lbs    Baseline NT - pt 3 wks s/p    Time 6   Period Weeks   Status New     OT LONG TERM GOAL #3   Title Function on PRHWE improve with more than 20 points    Baseline Function score on PRWHE at eval 39/50    Time 6   Period Weeks   Status New               Plan - 04/09/17 1827    Clinical Impression Statement Pt present 3 wks s/p ORIF L wrist fracture of radius - increase pain with wrist AROM , composite fist - report sensory changes at thumb and in palm - scar adhere and tight - painfull and tender - pt  show decrease wrist ROM  and strength in all planes - limiting her functional use of L dominant hand in ADL' and IADL's    Occupational performance deficits (Please refer to evaluation for details): ADL's;IADL's;Play;Leisure   Rehab Potential Good   Current Impairments/barriers affecting progress: sensory changes in thumb and palm since surgery   OT Frequency 2x / week    OT Duration 6 weeks   OT Treatment/Interventions Self-care/ADL training;Patient/family education;Splinting;Fluidtherapy;Contrast Bath;Therapeutic exercises;Scar mobilization;Passive range of motion;Manual Therapy;Parrafin   Plan assess progress with HEP    OT Home Exercise Plan see pt instruction   Consulted and Agree with Plan of Care Patient      Patient will benefit from skilled therapeutic intervention in order to improve the following deficits and impairments:  Decreased coordination, Decreased range of motion, Impaired flexibility, Decreased strength, Impaired UE functional use, Decreased scar mobility, Decreased knowledge of precautions, Increased edema, Impaired sensation  Visit Diagnosis: Stiffness of left wrist, not elsewhere classified - Plan: Ot plan of care cert/re-cert  Stiffness of left hand, not elsewhere classified - Plan: Ot plan of care cert/re-cert  Pain in left wrist - Plan: Ot plan of care cert/re-cert  Muscle weakness (generalized) - Plan: Ot plan of care cert/re-cert  Scar condition and fibrosis of skin - Plan: Ot plan of care cert/re-cert  Other disturbances of skin sensation - Plan: Ot plan of care cert/re-cert    Problem List Patient Active Problem List   Diagnosis Date Noted  . Distal radius fracture 03/17/2017    Rosalyn Gess OTR/L,CLT 04/09/2017, 9:31 PM  [facility] [address], [place], [address] Phone: [phone]   Fax:  [phone]  Name: Jennifer Larsen MRN: [id number] Date of Birth: 01/15/1936

## 2017-04-09 NOTE — Patient Instructions (Signed)
Contrast AROM tendonglides  AROM Opposition  10 reps   Wrist AROM flexion , ext, RD , UD and sup ,pronation  10 reps  Pain less than 1-2/10 And slight pull   Scar massage  Cica scar pad for night time  3 x day HEP

## 2017-04-13 ENCOUNTER — Ambulatory Visit: Payer: Medicare Other | Admitting: Occupational Therapy

## 2017-04-13 DIAGNOSIS — R208 Other disturbances of skin sensation: Secondary | ICD-10-CM

## 2017-04-13 DIAGNOSIS — M25632 Stiffness of left wrist, not elsewhere classified: Secondary | ICD-10-CM | POA: Diagnosis not present

## 2017-04-13 DIAGNOSIS — M25532 Pain in left wrist: Secondary | ICD-10-CM

## 2017-04-13 DIAGNOSIS — M25642 Stiffness of left hand, not elsewhere classified: Secondary | ICD-10-CM

## 2017-04-13 DIAGNOSIS — L905 Scar conditions and fibrosis of skin: Secondary | ICD-10-CM

## 2017-04-13 DIAGNOSIS — M6281 Muscle weakness (generalized): Secondary | ICD-10-CM

## 2017-04-13 NOTE — Patient Instructions (Signed)
Same HEP  But Add kinesiotape - 30% pull either side of scar and across 1 with 100 % pull   pt ed on replacing and precautions - to do cica scar pad for 24 hrs inbetween - replace taper over weekend again   Add and review Med N glide - 3-5 reps  2 x day

## 2017-04-13 NOTE — Therapy (Signed)
[facility] [address] [place], [address] Phone: [phone]   Fax:  [phone]  Occupational Therapy Treatment  Patient Details  Name: Jennifer Larsen MRN: [id number] Date of Birth: 07/25/1936 Referring Provider: Rudene Christians  Encounter Date: 04/13/2017      OT End of Session - 04/13/17 1739    Visit Number 2   Number of Visits 12   Date for OT Re-Evaluation 05/21/17   OT Start Time 3329   OT Stop Time 1625   OT Time Calculation (min) 47 min   Activity Tolerance Patient tolerated treatment well   Behavior During Therapy [facility] for tasks assessed/performed      Past Medical History:  Diagnosis Date  . Dysrhythmia 03/2017   left bundle branch block  . GERD (gastroesophageal reflux disease)   . Hard of hearing   . Hypertension   . Hypothyroidism   . Myocardial infarction ([place])    ekg shows left bundle branch block per dr. Ubaldo Glassing, NO heart attack  . Thyroid disease     Past Surgical History:  Procedure Laterality Date  . ABDOMINAL HYSTERECTOMY    . BREAST CYST ASPIRATION Left 03/24/2014  . BUNIONECTOMY    . DENTAL SURGERY     implants on lower teeth  . OPEN REDUCTION INTERNAL FIXATION (ORIF) DISTAL RADIAL FRACTURE Left 03/17/2017   Procedure: OPEN REDUCTION INTERNAL FIXATION (ORIF) DISTAL RADIAL FRACTURE;  Surgeon: Hessie Knows, MD;  Location: [facility] ORS;  Service: Orthopedics;  Laterality: Left;    There were no vitals filed for this visit.      Subjective Assessment - 04/13/17 1737    Subjective  Doing better -my motion think is better but the pins and needles in my thumb still and numbness in palm - will that get better    Patient Stated Goals I am L handed want to use it like before - and get numbness and pain better - lift , pull, open  objects, use it in bathing , dressing , yard work , ect    Currently in Pain? No/denies            [facility] OT Assessment - 04/13/17 0001      AROM   Left Forearm Pronation 85 Degrees   Left Forearm Supination 85 Degrees   Left Wrist Extension 45 Degrees   Left Wrist Flexion 53 Degrees   Left Wrist Radial Deviation 14 Degrees   Left Wrist Ulnar Deviation 25 Degrees                  OT Treatments/Exercises (OP) - 04/13/17 0001      Ultrasound   Ultrasound Location CT   Ultrasound Parameters 3.3MHZ at 20% , 1.0 intensity 4 min    Ultrasound Goals Other (Comment)  CT symptoms in thumb and palm      LUE Fluidotherapy   Number Minutes Fluidotherapy 8 Minutes   LUE Fluidotherapy Location Hand;Wrist   Comments AT SOC to increase AROM at wrist and decrease pain and stiffness       Measurements for AROM of wrist taken  See flowsheet   Fluido therapy done see flowsheet   Scar mobs done to volar wrist scar - using vibration and manaul massage by OT -  Adhere and limiting AROM  Add kinesiotape - 30% pull either side of scar and across 1 with 100 % pull   pt ed on replacing and precautions - to do cica scar pad for  24 hrs inbetween - replace taper over weekend again   Cont with AROM tendonglides  AROM Opposition  10 reps   Wrist AROM flexion , ext, RD , UD and sup ,pronation  10 reps  Pain less than 1-2/10 And slight pull  Add and review Med N glide - 3-5 reps  2 x day               OT Education - 04/13/17 1739    Education provided Yes   Education Details HEP changes    Person(s) Educated Patient   Methods Explanation;Demonstration;Tactile cues;Verbal cues;Handout   Comprehension Verbal cues required;Returned demonstration;Verbalized understanding          OT Short Term Goals - 04/09/17 2120      OT SHORT TERM GOAL #1   Title Pain in L wrist and hand improve with more than 15 points    Baseline pain on PRWHE at eval 25/50   Time 3   Period Weeks   Status New     OT SHORT TERM GOAL #2   Title Pt report improvement in sensory issues in L thumb and palm    Baseline Pt report some numbness in  palm and tingling in thumb all the time    Time 2   Period Weeks   Status New     OT SHORT TERM GOAL #5   Title L wrist AROM improve in all planes for pt to improve using hand 50% in bathing and dressing    Baseline see flowsheet - 80% difficulty    Time 4   Period Weeks   Status New           OT Long Term Goals - 04/09/17 2123      OT LONG TERM GOAL #1   Title L wrist AROM improve to [facility] in all planes to be able to use hand in ADL's and house work without increase symptoms    Baseline see flowsheet - 39/50 function impairement    Time 5   Period Weeks   Status New     OT LONG TERM GOAL #2   Title Grip strength and prehension strenght improve to at least more than 50% compare to R hand to carry 5 lbs    Baseline NT - pt 3 wks s/p    Time 6   Period Weeks   Status New     OT LONG TERM GOAL #3   Title Function on PRHWE improve with more than 20 points    Baseline Function score on PRWHE at eval 39/50    Time 6   Period Weeks   Status New               Plan - 04/13/17 1740    Clinical Impression Statement Pt showed increase AROM at wrist in all planes - cont to have sensory changes in palm and thumb - add Med N glide this date and scar adhere - add kinesiotape to scar this date and pt ed on using it this week and precautions    Occupational performance deficits (Please refer to evaluation for details): ADL's;IADL's;Play;Leisure   Current Impairments/barriers affecting progress: sensory changes in thumb and palm since surgery   OT Frequency 2x / week   OT Duration 6 weeks   OT Treatment/Interventions Self-care/ADL training;Patient/family education;Splinting;Fluidtherapy;Contrast Bath;Therapeutic exercises;Scar mobilization;Passive range of motion;Manual Therapy;Parrafin   Plan cont to upgrade- assess scar and can add PROM    Clinical Decision Making Several treatment options,  min-mod task modification necessary   OT Home Exercise Plan see pt instruction    Consulted and Agree with Plan of Care Patient      Patient will benefit from skilled therapeutic intervention in order to improve the following deficits and impairments:  Decreased coordination, Decreased range of motion, Impaired flexibility, Decreased strength, Impaired UE functional use, Decreased scar mobility, Decreased knowledge of precautions, Increased edema, Impaired sensation  Visit Diagnosis: Stiffness of left hand, not elsewhere classified  Pain in left wrist  Muscle weakness (generalized)  Scar condition and fibrosis of skin  Other disturbances of skin sensation  Stiffness of left wrist, not elsewhere classified      G-Codes - 05/06/2017 1414    Functional Assessment Tool Used (Outpatient only) ROM , pain , PRWHE, clinical judgement ,    Functional Limitation Self care   Self Care Current Status (Q3300) At least 40 percent but less than 60 percent impaired, limited or restricted   Self Care Goal Status (T6226) At least 1 percent but less than 20 percent impaired, limited or restricted      Problem List Patient Active Problem List   Diagnosis Date Noted  . Distal radius fracture 03/17/2017    Rosalyn Gess OTR/L,CLT 04/13/2017, 5:45 PM  [facility] [address] [place], [address] Phone: [phone]   Fax:  [phone]  Name: Alydia Gosser MRN: [id number] Date of Birth: [birth year]

## 2017-04-14 ENCOUNTER — Ambulatory Visit: Payer: Medicare Other

## 2017-04-20 ENCOUNTER — Ambulatory Visit: Payer: Medicare Other | Admitting: Occupational Therapy

## 2017-04-20 DIAGNOSIS — M25632 Stiffness of left wrist, not elsewhere classified: Secondary | ICD-10-CM

## 2017-04-20 DIAGNOSIS — L905 Scar conditions and fibrosis of skin: Secondary | ICD-10-CM

## 2017-04-20 DIAGNOSIS — M6281 Muscle weakness (generalized): Secondary | ICD-10-CM

## 2017-04-20 DIAGNOSIS — M25532 Pain in left wrist: Secondary | ICD-10-CM

## 2017-04-20 DIAGNOSIS — M25642 Stiffness of left hand, not elsewhere classified: Secondary | ICD-10-CM

## 2017-04-20 DIAGNOSIS — R208 Other disturbances of skin sensation: Secondary | ICD-10-CM

## 2017-04-20 NOTE — Therapy (Signed)
[facility] [address], [place], [address] Phone: [phone]   Fax:  [phone]  Occupational Therapy Treatment  Patient Details  Name: Jennifer Larsen MRN: [id number] Date of Birth: Aug 13, 1936 Referring Provider: Rudene Christians  Encounter Date: 04/20/2017      OT End of Session - 04/20/17 1430    Visit Number 3   Number of Visits 12   Date for OT Re-Evaluation 05/21/17   OT Start Time 1415   OT Stop Time 1453   OT Time Calculation (min) 38 min   Activity Tolerance Patient tolerated treatment well   Behavior During Therapy [facility] for tasks assessed/performed      Past Medical History:  Diagnosis Date  . Dysrhythmia 03/2017   left bundle branch block  . GERD (gastroesophageal reflux disease)   . Hard of hearing   . Hypertension   . Hypothyroidism   . Myocardial infarction ([place])    ekg shows left bundle branch block per dr. Ubaldo Glassing, NO heart attack  . Thyroid disease     Past Surgical History:  Procedure Laterality Date  . ABDOMINAL HYSTERECTOMY    . BREAST CYST ASPIRATION Left 03/24/2014  . BUNIONECTOMY    . DENTAL SURGERY     implants on lower teeth  . OPEN REDUCTION INTERNAL FIXATION (ORIF) DISTAL RADIAL FRACTURE Left 03/17/2017   Procedure: OPEN REDUCTION INTERNAL FIXATION (ORIF) DISTAL RADIAL FRACTURE;  Surgeon: Hessie Knows, MD;  Location: [facility] ORS;  Service: Orthopedics;  Laterality: Left;    There were no vitals filed for this visit.      Subjective Assessment - 04/20/17 1420    Subjective  Scar still tender - motion little better I think - I had some redness from the tape to scar - still some numbness at tip of thumb - bathing and dresing and eating - not strength yet    Patient Stated Goals I am L handed want to use it like before - and get numbness and pain better - lift , pull, open  objects, use it in bathing , dressing , yard work , ect    Currently in Pain? No/denies             [facility] OT Assessment - 04/20/17 0001      AROM   Left Wrist Extension 54 Degrees   Left Wrist Flexion 53 Degrees   Left Wrist Radial Deviation 17 Degrees   Left Wrist Ulnar Deviation 27 Degrees                  OT Treatments/Exercises (OP) - 04/20/17 0001      LUE Fluidotherapy   Number Minutes Fluidotherapy 10 Minutes   LUE Fluidotherapy Location Hand;Wrist   Comments AROM for wrist at [facility] to increase ROM - flexion increased 10 degrees       Measurements for AROM of wrist taken  See flowsheet   Fluido therapy done see flowsheet   Scar mobs done to volar wrist scar - using extractor and manaul massage by OT -  Adhere and limiting AROM   Pt to cont scar massage and cica scar pad for night time   Cont with AROM tendonglides  AROM Opposition  10 reps    PROM for wrist flexion, extention , RD, UD  And supination  10 reps  Add to HEP   Wrist AROM flexion , ext, RD , UD and sup ,pronation  10 reps  Pain less than 1-2/10 And  slight pull  Add and review Med N glide - 3-5 reps  2 x day             OT Education - 04/20/17 1429    Education provided Yes   Education Details Add PROM for wrsit    Person(s) Educated Patient   Methods Explanation;Demonstration;Tactile cues;Verbal cues;Handout   Comprehension Verbal cues required;Returned demonstration;Verbalized understanding          OT Short Term Goals - 04/09/17 2120      OT SHORT TERM GOAL #1   Title Pain in L wrist and hand improve with more than 15 points    Baseline pain on PRWHE at eval 25/50   Time 3   Period Weeks   Status New     OT SHORT TERM GOAL #2   Title Pt report improvement in sensory issues in L thumb and palm    Baseline Pt report some numbness in palm and tingling in thumb all the time    Time 2   Period Weeks   Status New     OT SHORT TERM GOAL #5   Title L wrist AROM improve in all planes for pt to improve using hand 50% in bathing and dressing    Baseline see  flowsheet - 80% difficulty    Time 4   Period Weeks   Status New           OT Long Term Goals - 04/09/17 2123      OT LONG TERM GOAL #1   Title L wrist AROM improve to [facility] in all planes to be able to use hand in ADL's and house work without increase symptoms    Baseline see flowsheet - 39/50 function impairement    Time 5   Period Weeks   Status New     OT LONG TERM GOAL #2   Title Grip strength and prehension strenght improve to at least more than 50% compare to R hand to carry 5 lbs    Baseline NT - pt 3 wks s/p    Time 6   Period Weeks   Status New     OT LONG TERM GOAL #3   Title Function on PRHWE improve with more than 20 points    Baseline Function score on PRWHE at eval 39/50    Time 6   Period Weeks   Status New               Plan - 04/20/17 1431    Clinical Impression Statement Pt is tomorrow 5 wks s/p - making progress weekly - intiated this date PROM to wrist - pt cont to have some numbness in palm and DIP of thumb - do not complain of pain -except if using it  with task to ready for    Occupational performance deficits (Please refer to evaluation for details): ADL's;IADL's;Play;Leisure   Rehab Potential Good   Current Impairments/barriers affecting progress: sensory changes in thumb and palm since surgery   OT Frequency 2x / week   OT Duration 4 weeks   OT Treatment/Interventions Self-care/ADL training;Patient/family education;Splinting;Fluidtherapy;Contrast Bath;Therapeutic exercises;Scar mobilization;Passive range of motion;Manual Therapy;Parrafin   Plan assess progress with PROM - and initiate maybe  1 lbs strengthening    OT Home Exercise Plan see pt instruction   Consulted and Agree with Plan of Care Patient      Patient will benefit from skilled therapeutic intervention in order to improve the following deficits and impairments:  Decreased coordination,  Decreased range of motion, Impaired flexibility, Decreased strength, Impaired UE functional  use, Decreased scar mobility, Decreased knowledge of precautions, Increased edema, Impaired sensation  Visit Diagnosis: Stiffness of left hand, not elsewhere classified  Pain in left wrist  Muscle weakness (generalized)  Scar condition and fibrosis of skin  Other disturbances of skin sensation  Stiffness of left wrist, not elsewhere classified    Problem List Patient Active Problem List   Diagnosis Date Noted  . Distal radius fracture 03/17/2017    Rosalyn Gess OTR/L,CLT 04/20/2017, 3:54 PM  [facility] [address], [place], [address] Phone: [phone]   Fax:  [phone]  Name: Jennifer Larsen MRN: [id number] Date of Birth: 03-Apr-1936

## 2017-04-20 NOTE — Patient Instructions (Addendum)
Add   PROM for wrist flexion, extention , RD, UD  And supination  10 reps

## 2017-04-23 ENCOUNTER — Ambulatory Visit: Payer: Medicare Other | Admitting: Occupational Therapy

## 2017-04-23 DIAGNOSIS — M25632 Stiffness of left wrist, not elsewhere classified: Secondary | ICD-10-CM | POA: Diagnosis not present

## 2017-04-23 DIAGNOSIS — M25642 Stiffness of left hand, not elsewhere classified: Secondary | ICD-10-CM

## 2017-04-23 DIAGNOSIS — M6281 Muscle weakness (generalized): Secondary | ICD-10-CM

## 2017-04-23 DIAGNOSIS — R208 Other disturbances of skin sensation: Secondary | ICD-10-CM

## 2017-04-23 DIAGNOSIS — M25532 Pain in left wrist: Secondary | ICD-10-CM

## 2017-04-23 DIAGNOSIS — L905 Scar conditions and fibrosis of skin: Secondary | ICD-10-CM

## 2017-04-23 NOTE — Therapy (Signed)
[facility] [address], [place], [address] Phone: [phone]   Fax:  [phone]  Occupational Therapy Treatment  Patient Details  Name: Jennifer Larsen MRN: [id number] Date of Birth: 11/11/1935 Referring Provider: Rudene Christians  Encounter Date: 04/23/2017      OT End of Session - 04/23/17 1752    Visit Number 4   Number of Visits 12   Date for OT Re-Evaluation 05/21/17   OT Start Time 1625   OT Stop Time 1714   OT Time Calculation (min) 49 min   Activity Tolerance Patient tolerated treatment well   Behavior During Therapy [facility] for tasks assessed/performed      Past Medical History:  Diagnosis Date  . Dysrhythmia 03/2017   left bundle branch block  . GERD (gastroesophageal reflux disease)   . Hard of hearing   . Hypertension   . Hypothyroidism   . Myocardial infarction ([place])    ekg shows left bundle branch block per dr. Ubaldo Glassing, NO heart attack  . Thyroid disease     Past Surgical History:  Procedure Laterality Date  . ABDOMINAL HYSTERECTOMY    . BREAST CYST ASPIRATION Left 03/24/2014  . BUNIONECTOMY    . DENTAL SURGERY     implants on lower teeth  . OPEN REDUCTION INTERNAL FIXATION (ORIF) DISTAL RADIAL FRACTURE Left 03/17/2017   Procedure: OPEN REDUCTION INTERNAL FIXATION (ORIF) DISTAL RADIAL FRACTURE;  Surgeon: Hessie Knows, MD;  Location: [facility] ORS;  Service: Orthopedics;  Laterality: Left;    There were no vitals filed for this visit.      Subjective Assessment - 04/23/17 1709    Subjective  (P)  I am doing okay - seen Dr yesterday - said I start taking off my splint - still afraid - hope you'll give me soft one - scar some what tender but doing okay with exercises and can tell I can use it little more at home    Patient Stated Goals (P)  I am L handed want to use it like before - and get numbness and pain better - lift , pull, open  objects, use it in bathing , dressing , yard work , Development worker, international aid OT Assessment - 04/23/17 0001      AROM   Left Forearm Pronation 90 Degrees   Left Forearm Supination 85 Degrees   Left Wrist Extension 55 Degrees   Left Wrist Flexion 63 Degrees   Left Wrist Radial Deviation 20 Degrees   Left Wrist Ulnar Deviation 28 Degrees                  OT Treatments/Exercises (OP) - 04/23/17 0001      LUE Fluidotherapy   Number Minutes Fluidotherapy 10 Minutes   LUE Fluidotherapy Location Hand;Wrist   Comments AROM to increase wrist flextion , ext, sup/pro    Measurements for AROM of wrist taken  See flowsheet   Fluido therapy done see flowsheet   Scar mobs done to volar wrist scar -  manaul massage by OT -  Adhere and limiting AROM   Pt to cont scar massage and cica scar pad for night time   Review with pt   PROM for wrist flexion, extention , RD, UD  Needed min A for hand placement  And supination  10 reps    Add 1 lbs for wrist in all planes - 12 reps  Pain free- pt to do 1 set 2 x day  And light blue putty gripping , lat and 3 point grip  12 reps   2 x day  Can increase Sunday to 2 sets if not increase  Pain  Cont with Med N glide - 3-5 reps             OT Education - 04/23/17 1752    Education provided Yes   Education Details review PROM and add 1 lbs weight and light blue putty HEP    Person(s) Educated Patient   Methods Explanation;Demonstration;Tactile cues;Verbal cues   Comprehension Verbal cues required;Returned demonstration;Verbalized understanding          OT Short Term Goals - 04/09/17 2120      OT SHORT TERM GOAL #1   Title Pain in L wrist and hand improve with more than 15 points    Baseline pain on PRWHE at eval 25/50   Time 3   Period Weeks   Status New     OT SHORT TERM GOAL #2   Title Pt report improvement in sensory issues in L thumb and palm    Baseline Pt report some numbness in palm and tingling in thumb all the time    Time 2   Period Weeks   Status New      OT SHORT TERM GOAL #5   Title L wrist AROM improve in all planes for pt to improve using hand 50% in bathing and dressing    Baseline see flowsheet - 80% difficulty    Time 4   Period Weeks   Status New           OT Long Term Goals - 04/09/17 2123      OT LONG TERM GOAL #1   Title L wrist AROM improve to [facility] in all planes to be able to use hand in ADL's and house work without increase symptoms    Baseline see flowsheet - 39/50 function impairement    Time 5   Period Weeks   Status New     OT LONG TERM GOAL #2   Title Grip strength and prehension strenght improve to at least more than 50% compare to R hand to carry 5 lbs    Baseline NT - pt 3 wks s/p    Time 6   Period Weeks   Status New     OT LONG TERM GOAL #3   Title Function on PRHWE improve with more than 20 points    Baseline Function score on PRWHE at eval 39/50    Time 6   Period Weeks   Status New               Plan - 04/23/17 1753    Clinical Impression Statement Pt  cont to make progress in ROM - strengthening started this week without increase pain - pt to cont with scar massage - fitted with Benik neoprene splint to use during day on and off with ADL's and IADl's    Occupational performance deficits (Please refer to evaluation for details): ADL's;IADL's;Play;Leisure   Rehab Potential Good   Current Impairments/barriers affecting progress: sensory changes in thumb and palm since surgery   OT Frequency 2x / week   OT Duration 4 weeks   OT Treatment/Interventions Self-care/ADL training;Patient/family education;Splinting;Fluidtherapy;Contrast Bath;Therapeutic exercises;Scar mobilization;Passive range of motion;Manual Therapy;Parrafin   Plan assess progress in updated HEP    OT Home Exercise Plan see pt instruction   Consulted and  Agree with Plan of Care Patient      Patient will benefit from skilled therapeutic intervention in order to improve the following deficits and impairments:  Decreased  coordination, Decreased range of motion, Impaired flexibility, Decreased strength, Impaired UE functional use, Decreased scar mobility, Decreased knowledge of precautions, Increased edema, Impaired sensation  Visit Diagnosis: Stiffness of left hand, not elsewhere classified  Pain in left wrist  Muscle weakness (generalized)  Scar condition and fibrosis of skin  Other disturbances of skin sensation  Stiffness of left wrist, not elsewhere classified    Problem List Patient Active Problem List   Diagnosis Date Noted  . Distal radius fracture 03/17/2017    Rosalyn Gess OTR/L,CLT 04/23/2017, 5:56 PM  [facility] [address] [place], [address] Phone: [phone]   Fax:  [phone]  Name: Fawn Desrocher MRN: [id number] Date of Birth: 1936/05/24

## 2017-04-28 ENCOUNTER — Ambulatory Visit: Payer: Medicare Other | Admitting: Occupational Therapy

## 2017-04-28 DIAGNOSIS — M25532 Pain in left wrist: Secondary | ICD-10-CM

## 2017-04-28 DIAGNOSIS — R208 Other disturbances of skin sensation: Secondary | ICD-10-CM

## 2017-04-28 DIAGNOSIS — L905 Scar conditions and fibrosis of skin: Secondary | ICD-10-CM

## 2017-04-28 DIAGNOSIS — M25632 Stiffness of left wrist, not elsewhere classified: Secondary | ICD-10-CM

## 2017-04-28 DIAGNOSIS — M6281 Muscle weakness (generalized): Secondary | ICD-10-CM

## 2017-04-28 DIAGNOSIS — M25642 Stiffness of left hand, not elsewhere classified: Secondary | ICD-10-CM

## 2017-04-28 NOTE — Therapy (Signed)
[facility] [address] [place], [address] Phone: [phone]   Fax:  [phone]  Occupational Therapy Treatment  Patient Details  Name: Jennifer Larsen MRN: [id number] Date of Birth: 1936-05-02 Referring Provider: Rudene Christians  Encounter Date: 04/28/2017      OT End of Session - 04/28/17 1540    Visit Number 5   Number of Visits 12   Date for OT Re-Evaluation 05/21/17   OT Start Time 3354   OT Stop Time 1530   OT Time Calculation (min) 43 min   Activity Tolerance Patient tolerated treatment well   Behavior During Therapy [facility] for tasks assessed/performed      Past Medical History:  Diagnosis Date  . Dysrhythmia 03/2017   left bundle branch block  . GERD (gastroesophageal reflux disease)   . Hard of hearing   . Hypertension   . Hypothyroidism   . Myocardial infarction ([place])    ekg shows left bundle branch block per dr. Ubaldo Glassing, NO heart attack  . Thyroid disease     Past Surgical History:  Procedure Laterality Date  . ABDOMINAL HYSTERECTOMY    . BREAST CYST ASPIRATION Left 03/24/2014  . BUNIONECTOMY    . DENTAL SURGERY     implants on lower teeth  . OPEN REDUCTION INTERNAL FIXATION (ORIF) DISTAL RADIAL FRACTURE Left 03/17/2017   Procedure: OPEN REDUCTION INTERNAL FIXATION (ORIF) DISTAL RADIAL FRACTURE;  Surgeon: Hessie Knows, MD;  Location: [facility] ORS;  Service: Orthopedics;  Laterality: Left;    There were no vitals filed for this visit.      Subjective Assessment - 04/28/17 1503    Subjective  I can tell I am using it more - at home, bathing and dressing - and eating now with that had   Patient Stated Goals I am L handed want to use it like before - and get numbness and pain better - lift , pull, open  objects, use it in bathing , dressing , yard work , ect    Currently in Pain? No/denies            [facility] OT Assessment - 04/28/17 0001      Strength   Right Hand Grip (lbs) 40   Right Hand Lateral Pinch 7 lbs   Right Hand 3 Point Pinch 12 lbs   Left Hand Grip (lbs) 21   Left Hand Lateral Pinch 9 lbs   Left Hand 3 Point Pinch 9 lbs      Measurements taken for grip and prehension   See flowsheet   Fluido therapy done to increase flexion and extention   Scar mobs done to volar wrist scar -  manaul massage by OT - pt to focus on distal and proximal parts Adhere and limiting AROM flexion and extention  Pt to cont scar massage and cica scar pad for night time   Review with pt per pt request  PROM for wrist flexion, extention , RD, UD  Needed min A for hand placement  And supination  10 reps    Pt did not had 1 lbs weight at home   Pt to cont the next 3 days 1 lbs for wrist in all planes - 12 reps  Pain free- pt to do2 sets 2 x day  Then 3 sets on Thursday   Upgrade to teal putty gripping , lat and 3 point grip  12 reps   2 x day  OT Treatments/Exercises (OP) - 04/28/17 0001      LUE Fluidotherapy   Number Minutes Fluidotherapy 10 Minutes   LUE Fluidotherapy Location Hand;Wrist   Comments AROM to wrist in all planes at [facility]                OT Education - 04/28/17 1540    Education provided Yes   Education Details update HEP and review PROM per pt request   Person(s) Educated Patient   Methods Explanation;Demonstration;Tactile cues;Verbal cues   Comprehension Verbalized understanding;Returned demonstration;Verbal cues required          OT Short Term Goals - 04/09/17 2120      OT SHORT TERM GOAL #1   Title Pain in L wrist and hand improve with more than 15 points    Baseline pain on PRWHE at eval 25/50   Time 3   Period Weeks   Status New     OT SHORT TERM GOAL #2   Title Pt report improvement in sensory issues in L thumb and palm    Baseline Pt report some numbness in palm and tingling in thumb all the time    Time 2   Period Weeks   Status New     OT SHORT TERM GOAL #5   Title L wrist AROM improve  in all planes for pt to improve using hand 50% in bathing and dressing    Baseline see flowsheet - 80% difficulty    Time 4   Period Weeks   Status New           OT Long Term Goals - 04/09/17 2123      OT LONG TERM GOAL #1   Title L wrist AROM improve to [facility] in all planes to be able to use hand in ADL's and house work without increase symptoms    Baseline see flowsheet - 39/50 function impairement    Time 5   Period Weeks   Status New     OT LONG TERM GOAL #2   Title Grip strength and prehension strenght improve to at least more than 50% compare to R hand to carry 5 lbs    Baseline NT - pt 3 wks s/p    Time 6   Period Weeks   Status New     OT LONG TERM GOAL #3   Title Function on PRHWE improve with more than 20 points    Baseline Function score on PRWHE at eval 39/50    Time 6   Period Weeks   Status New               Plan - 04/28/17 1541    Clinical Impression Statement Pt showing improvement in functional use - strength pt did not had correct weight at home - did upgrade her putty and pt to do 2-3 sets with 1 lbs this next 3 days   Occupational performance deficits (Please refer to evaluation for details): ADL's;IADL's;Play;Leisure   Rehab Potential Good   Current Impairments/barriers affecting progress: sensory changes in thumb and palm since surgery   OT Frequency 2x / week   OT Duration 2 weeks   OT Treatment/Interventions Self-care/ADL training;Patient/family education;Splinting;Fluidtherapy;Contrast Bath;Therapeutic exercises;Scar mobilization;Passive range of motion;Manual Therapy;Parrafin   Plan upgrade to 2 lbs weight if able -reassess how did with teal putty   OT Home Exercise Plan see pt instruction   Consulted and Agree with Plan of Care Patient      Patient will benefit from skilled  therapeutic intervention in order to improve the following deficits and impairments:  Decreased coordination, Decreased range of motion, Impaired flexibility,  Decreased strength, Impaired UE functional use, Decreased scar mobility, Decreased knowledge of precautions, Increased edema, Impaired sensation  Visit Diagnosis: Stiffness of left hand, not elsewhere classified  Pain in left wrist  Muscle weakness (generalized)  Scar condition and fibrosis of skin  Other disturbances of skin sensation  Stiffness of left wrist, not elsewhere classified    Problem List Patient Active Problem List   Diagnosis Date Noted  . Distal radius fracture 03/17/2017    Rosalyn Gess OTR/L,CLT 04/28/2017, 3:43 PM  [facility] [address] [place], [address] Phone: [phone]   Fax:  [phone]  Name: Jennifer Larsen MRN: [id number] Date of Birth: 10/06/1936

## 2017-04-30 ENCOUNTER — Encounter: Payer: Medicare Other | Admitting: Occupational Therapy

## 2017-05-01 ENCOUNTER — Ambulatory Visit: Payer: Medicare Other | Admitting: Occupational Therapy

## 2017-05-01 DIAGNOSIS — M25632 Stiffness of left wrist, not elsewhere classified: Secondary | ICD-10-CM | POA: Diagnosis not present

## 2017-05-01 DIAGNOSIS — R208 Other disturbances of skin sensation: Secondary | ICD-10-CM

## 2017-05-01 DIAGNOSIS — M6281 Muscle weakness (generalized): Secondary | ICD-10-CM

## 2017-05-01 DIAGNOSIS — M25532 Pain in left wrist: Secondary | ICD-10-CM

## 2017-05-01 DIAGNOSIS — M25642 Stiffness of left hand, not elsewhere classified: Secondary | ICD-10-CM

## 2017-05-01 DIAGNOSIS — L905 Scar conditions and fibrosis of skin: Secondary | ICD-10-CM

## 2017-05-01 NOTE — Patient Instructions (Signed)
Pt to do same HEP but 2 sessions a day  Upgrade to 2 lbs for wrist in all planes  12 reps  And putty teal only for gripping 2 x day  Can increase in 3 days if no increase pain - do 2 sessions , 2 x day  Cont gentle scar massage and wrist flexion and extentoin stretch prior to ROM

## 2017-05-01 NOTE — Therapy (Signed)
[facility] [address], [place], [address] Phone: [phone]   Fax:  [phone]  Occupational Therapy Treatment  Patient Details  Name: Jennifer Larsen MRN: [id number] Date of Birth: 06/09/1936 Referring Provider: Rudene Christians  Encounter Date: 05/01/2017      OT End of Session - 05/01/17 1617    Visit Number 6   Number of Visits 12   Date for OT Re-Evaluation 05/21/17   OT Start Time 1026   OT Stop Time 1110   OT Time Calculation (min) 44 min   Activity Tolerance Patient tolerated treatment well   Behavior During Therapy [facility] for tasks assessed/performed      Past Medical History:  Diagnosis Date  . Dysrhythmia 03/2017   left bundle branch block  . GERD (gastroesophageal reflux disease)   . Hard of hearing   . Hypertension   . Hypothyroidism   . Myocardial infarction ([place])    ekg shows left bundle branch block per dr. Ubaldo Glassing, NO heart attack  . Thyroid disease     Past Surgical History:  Procedure Laterality Date  . ABDOMINAL HYSTERECTOMY    . BREAST CYST ASPIRATION Left 03/24/2014  . BUNIONECTOMY    . DENTAL SURGERY     implants on lower teeth  . OPEN REDUCTION INTERNAL FIXATION (ORIF) DISTAL RADIAL FRACTURE Left 03/17/2017   Procedure: OPEN REDUCTION INTERNAL FIXATION (ORIF) DISTAL RADIAL FRACTURE;  Surgeon: Hessie Knows, MD;  Location: [facility] ORS;  Service: Orthopedics;  Laterality: Left;    There were no vitals filed for this visit.      Subjective Assessment - 05/01/17 1613    Subjective  I did use the 1 lbs weight for wrist but I did the exercises 2 x at the same time - one time a day - I did use my hand a lot yesterday - did not wear splint except when out - but the scar is some what tender    Patient Stated Goals I am L handed want to use it like before - and get numbness and pain better - lift , pull, open  objects, use it in bathing , dressing , yard work , ect    Currently in  Pain? Yes   Pain Score 2    Pain Location Finger (Comment which one)  L thumb    Pain Orientation Left            [facility] OT Assessment - 05/01/17 0001      Strength   Right Hand Grip (lbs) 40   Right Hand Lateral Pinch 7 lbs   Right Hand 3 Point Pinch 12 lbs   Left Hand Grip (lbs) 30   Left Hand Lateral Pinch 9 lbs   Left Hand 3 Point Pinch 9 lbs                  OT Treatments/Exercises (OP) - 05/01/17 0001      LUE Paraffin   Number Minutes Paraffin 10 Minutes   LUE Paraffin Location Hand;Wrist   Comments at [facility] to decrease pain at thumb , scar  tenderness and increase ROM at wrist     assess progress and how did with HEP  Pt did HEP 2 sets but one time a day   Putty kept same but only gripping - teal - hold off on prehension because of pain at thumb [place] for wrist flexion and extention  CPM on BTE for wrist extention  200 sec , flexion 200 sec  2 lbs for sup /pro, UD ,and RD , flexion and extention - no pain 12 reps   Pt to do same HEP but 2 sessions a day  Upgrade to 2 lbs for wrist in all planes  12 reps  And putty teal only for gripping 2 x day  Can increase in 3 days if no increase pain - do 2 sessions , 2 x day  Cont gentle scar massage and wrist flexion and extentoin stretch prior to ROM  cica scar pad at night time to decrease scar adhesion - to increase ROM   Reinforce to do 2 sessions a day  3 days later 2 sets - 2 sessions day  But not pain             OT Education - 05/01/17 1616    Education provided Yes   Education Details HEP upgrade   Person(s) Educated Patient   Methods Explanation;Demonstration;Tactile cues;Verbal cues;Handout   Comprehension Verbalized understanding;Returned demonstration;Verbal cues required          OT Short Term Goals - 04/09/17 2120      OT SHORT TERM GOAL #1   Title Pain in L wrist and hand improve with more than 15 points    Baseline pain on PRWHE at eval 25/50   Time 3   Period Weeks    Status New     OT SHORT TERM GOAL #2   Title Pt report improvement in sensory issues in L thumb and palm    Baseline Pt report some numbness in palm and tingling in thumb all the time    Time 2   Period Weeks   Status New     OT SHORT TERM GOAL #5   Title L wrist AROM improve in all planes for pt to improve using hand 50% in bathing and dressing    Baseline see flowsheet - 80% difficulty    Time 4   Period Weeks   Status New           OT Long Term Goals - 04/09/17 2123      OT LONG TERM GOAL #1   Title L wrist AROM improve to [facility] in all planes to be able to use hand in ADL's and house work without increase symptoms    Baseline see flowsheet - 39/50 function impairement    Time 5   Period Weeks   Status New     OT LONG TERM GOAL #2   Title Grip strength and prehension strenght improve to at least more than 50% compare to R hand to carry 5 lbs    Baseline NT - pt 3 wks s/p    Time 6   Period Weeks   Status New     OT LONG TERM GOAL #3   Title Function on PRHWE improve with more than 20 points    Baseline Function score on PRWHE at eval 39/50    Time 6   Period Weeks   Status New               Plan - 05/01/17 1617    Clinical Impression Statement Pt showing good progress in grip , and functional use - report not wearing splints as much - pt to wear cica scar pad for night time - and upgrade to 2 lbs and putty only gripping - hold off on prehension because of increase pain at thumb - and prehension is 9lbs  Occupational performance deficits (Please refer to evaluation for details): ADL's;IADL's;Play;Leisure   Rehab Potential Good   Current Impairments/barriers affecting progress: sensory changes in thumb and palm since surgery   OT Frequency 2x / week   OT Duration 2 weeks   OT Treatment/Interventions Self-care/ADL training;Patient/family education;Splinting;Fluidtherapy;Contrast Bath;Therapeutic exercises;Scar mobilization;Passive range of motion;Manual  Therapy;Parrafin   Plan assess progress and HEP    Clinical Decision Making Several treatment options, min-mod task modification necessary   OT Home Exercise Plan see pt instruction   Consulted and Agree with Plan of Care Patient      Patient will benefit from skilled therapeutic intervention in order to improve the following deficits and impairments:  Decreased coordination, Decreased range of motion, Impaired flexibility, Decreased strength, Impaired UE functional use, Decreased scar mobility, Decreased knowledge of precautions, Increased edema, Impaired sensation  Visit Diagnosis: Stiffness of left hand, not elsewhere classified  Pain in left wrist  Muscle weakness (generalized)  Scar condition and fibrosis of skin  Other disturbances of skin sensation  Stiffness of left wrist, not elsewhere classified    Problem List Patient Active Problem List   Diagnosis Date Noted  . Distal radius fracture 03/17/2017    Rosalyn Gess OTR/L,CLT 05/01/2017, 4:20 PM  [facility] [address] [place], [address] Phone: [phone]   Fax:  [phone]  Name: Jennifer Larsen MRN: [id number] Date of Birth: [birth year]

## 2017-05-07 ENCOUNTER — Ambulatory Visit: Payer: Medicare Other | Attending: Orthopedic Surgery | Admitting: Occupational Therapy

## 2017-05-07 DIAGNOSIS — R208 Other disturbances of skin sensation: Secondary | ICD-10-CM | POA: Diagnosis present

## 2017-05-07 DIAGNOSIS — M25532 Pain in left wrist: Secondary | ICD-10-CM | POA: Diagnosis present

## 2017-05-07 DIAGNOSIS — M6281 Muscle weakness (generalized): Secondary | ICD-10-CM | POA: Insufficient documentation

## 2017-05-07 DIAGNOSIS — M25632 Stiffness of left wrist, not elsewhere classified: Secondary | ICD-10-CM | POA: Insufficient documentation

## 2017-05-07 DIAGNOSIS — M25642 Stiffness of left hand, not elsewhere classified: Secondary | ICD-10-CM | POA: Diagnosis not present

## 2017-05-07 DIAGNOSIS — L905 Scar conditions and fibrosis of skin: Secondary | ICD-10-CM | POA: Diagnosis present

## 2017-05-07 NOTE — Therapy (Signed)
[place] [facility] [address] [place], [address] Phone: [phone]   Fax:  [phone]  Occupational Therapy Treatment  Patient Details  Name: Jennifer Larsen MRN: [id number] Date of Birth: June 12, 1936 Referring Provider: Rudene Christians  Encounter Date: 05/07/2017      OT End of Session - 05/07/17 1958    Visit Number 7   Number of Visits 12   Date for OT Re-Evaluation 05/21/17   OT Start Time 1625   OT Stop Time 1711   OT Time Calculation (min) 46 min   Activity Tolerance Patient tolerated treatment well   Behavior During Therapy [facility] for tasks assessed/performed      Past Medical History:  Diagnosis Date  . Dysrhythmia 03/2017   left bundle branch block  . GERD (gastroesophageal reflux disease)   . Hard of hearing   . Hypertension   . Hypothyroidism   . Myocardial infarction ([place])    ekg shows left bundle branch block per dr. Ubaldo Glassing, NO heart attack  . Thyroid disease     Past Surgical History:  Procedure Laterality Date  . ABDOMINAL HYSTERECTOMY    . BREAST CYST ASPIRATION Left 03/24/2014  . BUNIONECTOMY    . DENTAL SURGERY     implants on lower teeth  . OPEN REDUCTION INTERNAL FIXATION (ORIF) DISTAL RADIAL FRACTURE Left 03/17/2017   Procedure: OPEN REDUCTION INTERNAL FIXATION (ORIF) DISTAL RADIAL FRACTURE;  Surgeon: Hessie Knows, MD;  Location: [facility] ORS;  Service: Orthopedics;  Laterality: Left;    There were no vitals filed for this visit.      Subjective Assessment - 05/07/17 1945    Subjective  I am using my hand more - doing 2 lbs weight - if something is to heavy and I have pain I just don't do it - scar not as tender but still stuck - that is how I do it    Patient Stated Goals I am L handed want to use it like before - and get numbness and pain better - lift , pull, open  objects, use it in bathing , dressing , yard work , ect    Currently in Pain? No/denies                       OT Treatments/Exercises (OP) - 05/07/17 0001      LUE Paraffin   Number Minutes Paraffin 10 Minutes   LUE Paraffin Location Hand;Wrist   Comments at [facility] to increase ROM and  decrease scar tissue       assess grip and prehension - see flowsheet  Paraffin done to L hand and wrist with flexion stretch 3 min and extention stretch 58min - each increase with 5 degrees  Pt to do at home  Scar massage done and pt ed - pt was pushing maybe to hard   only to do scar mobs - all direction and scar pad for night time   2 lbs for sup /pro, UD ,and RD , flexion and extention - no pain 12 reps   putty teal only for gripping 12 reps no pain  Pt to cont at home for 3 wks - and will reassess             OT Education - 05/07/17 1958    Education provided Yes   Education Details HEP changes - stretches reviewed    Person(s) Educated Patient   Methods Explanation;Demonstration;Tactile cues;Verbal cues;Handout   Comprehension  Verbal cues required;Returned demonstration;Verbalized understanding          OT Short Term Goals - 04/09/17 2120      OT SHORT TERM GOAL #1   Title Pain in L wrist and hand improve with more than 15 points    Baseline pain on PRWHE at eval 25/50   Time 3   Period Weeks   Status New     OT SHORT TERM GOAL #2   Title Pt report improvement in sensory issues in L thumb and palm    Baseline Pt report some numbness in palm and tingling in thumb all the time    Time 2   Period Weeks   Status New     OT SHORT TERM GOAL #5   Title L wrist AROM improve in all planes for pt to improve using hand 50% in bathing and dressing    Baseline see flowsheet - 80% difficulty    Time 4   Period Weeks   Status New           OT Long Term Goals - 04/09/17 2123      OT LONG TERM GOAL #1   Title L wrist AROM improve to [facility] in all planes to be able to use hand in ADL's and house work without increase symptoms    Baseline see flowsheet -  39/50 function impairement    Time 5   Period Weeks   Status New     OT LONG TERM GOAL #2   Title Grip strength and prehension strenght improve to at least more than 50% compare to R hand to carry 5 lbs    Baseline NT - pt 3 wks s/p    Time 6   Period Weeks   Status New     OT LONG TERM GOAL #3   Title Function on PRHWE improve with more than 20 points    Baseline Function score on PRWHE at eval 39/50    Time 6   Period Weeks   Status New               Plan - 05/07/17 1958    Clinical Impression Statement Pt made progress in prehension but grip stayed the same - Wrist ROM same and scar still adhere - pt to cont wtih HEP for 3 wks - focus on scar massage , stretch for wrist flexion and exteiont and functioanal use - keep pain no more than 2/10 with use     Occupational performance deficits (Please refer to evaluation for details): ADL's;IADL's;Play;Leisure   Rehab Potential Good   Current Impairments/barriers affecting progress: sensory changes in thumb and palm since surgery   OT Frequency Biweekly   OT Duration 4 weeks   OT Treatment/Interventions Self-care/ADL training;Patient/family education;Splinting;Fluidtherapy;Contrast Bath;Therapeutic exercises;Scar mobilization;Passive range of motion;Manual Therapy;Parrafin   Plan assess progress and possible discharge    Clinical Decision Making Limited treatment options, no task modification necessary   OT Home Exercise Plan see pt instruction   Consulted and Agree with Plan of Care Patient      Patient will benefit from skilled therapeutic intervention in order to improve the following deficits and impairments:  Decreased coordination, Decreased range of motion, Impaired flexibility, Decreased strength, Impaired UE functional use, Decreased scar mobility, Decreased knowledge of precautions, Increased edema, Impaired sensation  Visit Diagnosis: Stiffness of left hand, not elsewhere classified  Pain in left wrist  Muscle  weakness (generalized)  Scar condition and fibrosis of skin  Other disturbances  of skin sensation  Stiffness of left wrist, not elsewhere classified    Problem List Patient Active Problem List   Diagnosis Date Noted  . Distal radius fracture 03/17/2017    Rosalyn Gess OTR/L,CLT 05/07/2017, 8:01 PM  [place] [facility] [address] [place], [address] Phone: [phone]   Fax:  [phone]  Name: Jennifer Larsen MRN: [id number] Date of Birth: 1936-07-23

## 2017-05-07 NOTE — Patient Instructions (Addendum)
Same HEP but do stretch for flexion and extention of wrist - with heatingpad

## 2017-05-27 ENCOUNTER — Ambulatory Visit
Admission: RE | Admit: 2017-05-27 | Discharge: 2017-05-27 | Disposition: A | Discharge status: Home / Self Care | Payer: Medicare Other | Source: Ambulatory Visit | Attending: Internal Medicine | Admitting: Internal Medicine

## 2017-05-27 DIAGNOSIS — Z1231 Encounter for screening mammogram for malignant neoplasm of breast: Secondary | ICD-10-CM | POA: Insufficient documentation

## 2017-05-28 ENCOUNTER — Ambulatory Visit: Payer: Medicare Other | Admitting: Occupational Therapy

## 2017-05-28 DIAGNOSIS — M25532 Pain in left wrist: Secondary | ICD-10-CM

## 2017-05-28 DIAGNOSIS — M25642 Stiffness of left hand, not elsewhere classified: Secondary | ICD-10-CM | POA: Diagnosis not present

## 2017-05-28 DIAGNOSIS — R208 Other disturbances of skin sensation: Secondary | ICD-10-CM

## 2017-05-28 DIAGNOSIS — M25632 Stiffness of left wrist, not elsewhere classified: Secondary | ICD-10-CM

## 2017-05-28 DIAGNOSIS — L905 Scar conditions and fibrosis of skin: Secondary | ICD-10-CM

## 2017-05-28 DIAGNOSIS — M6281 Muscle weakness (generalized): Secondary | ICD-10-CM

## 2017-05-28 NOTE — Therapy (Signed)
[place] [facility] [address], [place], [address] Phone: [phone]   Fax:  [phone]  Occupational Therapy Treatment/discharge   Patient Details  Name: Jennifer Larsen MRN: [id number] Date of Birth: 05/15/1936 Referring Provider: Rudene Christians  Encounter Date: 05/28/2017      OT End of Session - 05/28/17 1332    Visit Number 8   Number of Visits 8   Date for OT Re-Evaluation 05/28/17   OT Start Time 1255   OT Stop Time 1325   OT Time Calculation (min) 30 min   Activity Tolerance Patient tolerated treatment well   Behavior During Therapy [facility] for tasks assessed/performed      Past Medical History:  Diagnosis Date  . Dysrhythmia 03/2017   left bundle branch block  . GERD (gastroesophageal reflux disease)   . Hard of hearing   . Hypertension   . Hypothyroidism   . Myocardial infarction ([place])    ekg shows left bundle branch block per dr. Ubaldo Glassing, NO heart attack  . Thyroid disease     Past Surgical History:  Procedure Laterality Date  . ABDOMINAL HYSTERECTOMY    . BREAST CYST ASPIRATION Left 03/24/2014  . BUNIONECTOMY    . DENTAL SURGERY     implants on lower teeth  . OPEN REDUCTION INTERNAL FIXATION (ORIF) DISTAL RADIAL FRACTURE Left 03/17/2017   Procedure: OPEN REDUCTION INTERNAL FIXATION (ORIF) DISTAL RADIAL FRACTURE;  Surgeon: Hessie Knows, MD;  Location: [facility] ORS;  Service: Orthopedics;  Laterality: Left;    There were no vitals filed for this visit.      Subjective Assessment - 05/28/17 1256    Subjective  Seen Dr Rudene Christians last week - using it more - started some dancing again , can do 2 lbs but no pain - numbness only now in my tip of thumb - if something to heavy - I back off - I do reach with my hand more    Patient Stated Goals I am L handed want to use it like before - and get numbness and pain better - lift , pull, open  objects, use it in bathing , dressing , yard work , ect    Currently in  Pain? No/denies            [facility] OT Assessment - 05/28/17 0001      AROM   Left Wrist Extension 63 Degrees   Left Wrist Flexion 70 Degrees     Strength   Right Hand Grip (lbs) 40   Right Hand Lateral Pinch 7 lbs   Right Hand 3 Point Pinch 12 lbs   Left Hand Grip (lbs) 36   Left Hand Lateral Pinch 10 lbs   Left Hand 3 Point Pinch 12 lbs      Assess AROM for wrist , grip and prehension strength - see flowsheet  Scar still adhere distally but improve to cont with at home   PRWHE done and simulated some act - pain decrease to 10/50 and function 3/50  Pt to cont with wrist ext and flexion stretch  At home  Scar mobs  And while having some numbness in DIP of thumb with Med N glide and tendon glide                      OT Education - 05/28/17 1332    Education provided Yes   Education Details discharge instructions    Person(s) Educated Patient  Methods Explanation;Demonstration;Tactile cues   Comprehension Returned demonstration;Verbalized understanding          OT Short Term Goals - 05/28/17 1338      OT SHORT TERM GOAL #1   Title Pain in L wrist and hand improve with more than 15 points    Baseline pain on PRWHE at eval 25/50 and now 10/50   Status Achieved     OT SHORT TERM GOAL #2   Title Pt report improvement in sensory issues in L thumb and palm    Baseline Pt report some numbness in palm and tingling in thumb all the time at eval but now only DIP of thumb    Status Partially Met           OT Long Term Goals - 05/28/17 1339      OT LONG TERM GOAL #1   Title L wrist AROM improve to [facility] in all planes to be able to use hand in ADL's and house work without increase symptoms    Status Achieved     OT LONG TERM GOAL #2   Title Grip strength and prehension strenght improve to at least more than 50% compare to R hand to carry 5 lbs    Baseline Grip on L 36 , R 40    Status Achieved     OT LONG TERM GOAL #3   Title Function on PRHWE  improve with more than 20 points    Baseline Function score on PRWHE at eval 39/50  and now 3/50   Status Achieved               Plan - 05/28/17 1336    Clinical Impression Statement Pt made great progress on her own with HEP for the last 3 wks - and will be discharge with HEP for wrist ext, flexion stretch , scar mobs and  Med N glide /tendon glide while having some tnumnbess still in thumb DIP - pt  report it  use to be the whole thumb - educated pt on nerve healing - and than it is improving - to contact surgeon if not improve -    OT Treatment/Interventions Self-care/ADL training;Patient/family education;Splinting;Fluidtherapy;Contrast Bath;Therapeutic exercises;Scar mobilization;Passive range of motion;Manual Therapy;Parrafin   Plan discharge with HEP    OT Home Exercise Plan see pt instruction   Consulted and Agree with Plan of Care Patient      Patient will benefit from skilled therapeutic intervention in order to improve the following deficits and impairments:     Visit Diagnosis: Stiffness of left hand, not elsewhere classified  Pain in left wrist  Muscle weakness (generalized)  Scar condition and fibrosis of skin  Other disturbances of skin sensation  Stiffness of left wrist, not elsewhere classified    Problem List Patient Active Problem List   Diagnosis Date Noted  . Distal radius fracture 03/17/2017    Rosalyn Gess OTR/L,CLT 05/28/2017, 1:40 PM  [place] [facility] [address] [place], [address] Phone: [phone]   Fax:  [phone]  Name: Jennifer Larsen MRN: [id number] Date of Birth: 03/10/1936

## 2017-09-14 ENCOUNTER — Encounter: Payer: Self-pay | Admitting: *Deleted

## 2017-09-15 NOTE — Discharge Instructions (Signed)

## 2017-09-16 ENCOUNTER — Ambulatory Visit
Admission: RE | Admit: 2017-09-16 | Discharge: 2017-09-16 | Disposition: A | Discharge status: Home / Self Care | Payer: Medicare Other | Source: Ambulatory Visit | Attending: Ophthalmology | Admitting: Ophthalmology

## 2017-09-16 ENCOUNTER — Ambulatory Visit: Payer: Medicare Other | Admitting: Anesthesiology

## 2017-09-16 ENCOUNTER — Encounter
Admission: RE | Disposition: A | Discharge status: Home / Self Care | Payer: Self-pay | Source: Ambulatory Visit | Attending: Ophthalmology

## 2017-09-16 DIAGNOSIS — Z90710 Acquired absence of both cervix and uterus: Secondary | ICD-10-CM | POA: Diagnosis not present

## 2017-09-16 DIAGNOSIS — I10 Essential (primary) hypertension: Secondary | ICD-10-CM | POA: Insufficient documentation

## 2017-09-16 DIAGNOSIS — E079 Disorder of thyroid, unspecified: Secondary | ICD-10-CM | POA: Diagnosis not present

## 2017-09-16 DIAGNOSIS — Z7982 Long term (current) use of aspirin: Secondary | ICD-10-CM | POA: Insufficient documentation

## 2017-09-16 DIAGNOSIS — M1990 Unspecified osteoarthritis, unspecified site: Secondary | ICD-10-CM | POA: Insufficient documentation

## 2017-09-16 DIAGNOSIS — I447 Left bundle-branch block, unspecified: Secondary | ICD-10-CM | POA: Insufficient documentation

## 2017-09-16 DIAGNOSIS — K219 Gastro-esophageal reflux disease without esophagitis: Secondary | ICD-10-CM | POA: Insufficient documentation

## 2017-09-16 DIAGNOSIS — Z79890 Hormone replacement therapy (postmenopausal): Secondary | ICD-10-CM | POA: Diagnosis not present

## 2017-09-16 DIAGNOSIS — Z9889 Other specified postprocedural states: Secondary | ICD-10-CM | POA: Insufficient documentation

## 2017-09-16 DIAGNOSIS — Z79899 Other long term (current) drug therapy: Secondary | ICD-10-CM | POA: Diagnosis not present

## 2017-09-16 DIAGNOSIS — H2512 Age-related nuclear cataract, left eye: Secondary | ICD-10-CM | POA: Diagnosis not present

## 2017-09-16 DIAGNOSIS — Z881 Allergy status to other antibiotic agents status: Secondary | ICD-10-CM | POA: Insufficient documentation

## 2017-09-16 HISTORY — DX: Unspecified osteoarthritis, unspecified site: M19.90

## 2017-09-16 HISTORY — DX: Presence of dental prosthetic device (complete) (partial): Z97.2

## 2017-09-16 HISTORY — PX: CATARACT EXTRACTION W/PHACO: SHX586

## 2017-09-16 SURGERY — PHACOEMULSIFICATION, CATARACT, WITH IOL INSERTION
Anesthesia: Monitor Anesthesia Care | Site: Eye | Laterality: Left | Wound class: Clean

## 2017-09-16 MED ORDER — FENTANYL CITRATE (PF) 100 MCG/2ML IJ SOLN
INTRAMUSCULAR | Status: DC | PRN
  Administered 2017-09-16: 50 ug via INTRAVENOUS

## 2017-09-16 MED ORDER — EPINEPHRINE PF 1 MG/ML IJ SOLN
INTRAOCULAR | Status: DC | PRN
  Administered 2017-09-16: 80 mL via OPHTHALMIC

## 2017-09-16 MED ORDER — MOXIFLOXACIN HCL 0.5 % OP SOLN
OPHTHALMIC | Status: DC | PRN
Start: 2017-09-16 — End: 2017-09-16
  Administered 2017-09-16: 0.2 mL via OPHTHALMIC

## 2017-09-16 MED ORDER — FENTANYL CITRATE (PF) 100 MCG/2ML IJ SOLN: 25.0000 ug | INTRAMUSCULAR | Status: DC | PRN

## 2017-09-16 MED ORDER — NA HYALUR & NA CHOND-NA HYALUR 0.4-0.35 ML IO KIT
PACK | INTRAOCULAR | Status: DC | PRN
  Administered 2017-09-16: 1 mL via INTRAOCULAR

## 2017-09-16 MED ORDER — LACTATED RINGERS IV SOLN: 10.0000 mL/h | INTRAVENOUS | Status: DC

## 2017-09-16 MED ORDER — BRIMONIDINE TARTRATE-TIMOLOL 0.2-0.5 % OP SOLN
OPHTHALMIC | Status: DC | PRN
Start: 2017-09-16 — End: 2017-09-16
  Administered 2017-09-16: 1 [drp] via OPHTHALMIC

## 2017-09-16 MED ORDER — ARMC OPHTHALMIC DILATING DROPS
1.0000 "application " | OPHTHALMIC | Status: AC
  Administered 2017-09-16 (×3): 1 via OPHTHALMIC

## 2017-09-16 MED ORDER — OXYCODONE HCL 5 MG/5ML PO SOLN: 5.0000 mg | Freq: Once | ORAL | Status: DC | PRN

## 2017-09-16 MED ORDER — POLYMYXIN B-TRIMETHOPRIM 10000-0.1 UNIT/ML-% OP SOLN
1.0000 [drp] | OPHTHALMIC | Status: AC
  Administered 2017-09-16 (×3): 1 [drp] via OPHTHALMIC

## 2017-09-16 MED ORDER — MIDAZOLAM HCL 2 MG/2ML IJ SOLN
INTRAMUSCULAR | Status: DC | PRN
  Administered 2017-09-16: 2 mg via INTRAVENOUS

## 2017-09-16 MED ORDER — PROMETHAZINE HCL 25 MG/ML IJ SOLN: 6.2500 mg | INTRAMUSCULAR | Status: DC | PRN

## 2017-09-16 MED ORDER — MEPERIDINE HCL 25 MG/ML IJ SOLN: 6.2500 mg | INTRAMUSCULAR | Status: DC | PRN

## 2017-09-16 MED ORDER — LIDOCAINE HCL (PF) 2 % IJ SOLN
INTRAMUSCULAR | Status: DC | PRN
  Administered 2017-09-16: 1 mL via INTRAMUSCULAR

## 2017-09-16 MED ORDER — OXYCODONE HCL 5 MG PO TABS: 5.0000 mg | ORAL_TABLET | Freq: Once | ORAL | Status: DC | PRN

## 2017-09-16 SURGICAL SUPPLY — 25 items
CANNULA ANT/CHMB 27GA (MISCELLANEOUS) ×3 IMPLANT
CARTRIDGE ABBOTT (MISCELLANEOUS) IMPLANT
GLOVE SURG LX 7.5 STRW (GLOVE) ×2
GLOVE SURG LX STRL 7.5 STRW (GLOVE) ×1 IMPLANT
GLOVE SURG TRIUMPH 8.0 PF LTX (GLOVE) ×3 IMPLANT
GOWN STRL REUS W/ TWL LRG LVL3 (GOWN DISPOSABLE) ×2 IMPLANT
GOWN STRL REUS W/TWL LRG LVL3 (GOWN DISPOSABLE) ×4
LENS IOL TECNIS ITEC 19.5 (Intraocular Lens) ×3 IMPLANT
MARKER SKIN DUAL TIP RULER LAB (MISCELLANEOUS) ×3 IMPLANT
NDL RETROBULBAR .5 NSTRL (NEEDLE) IMPLANT
NEEDLE FILTER BLUNT 18X 1/2SAF (NEEDLE) ×2
NEEDLE FILTER BLUNT 18X1 1/2 (NEEDLE) ×1 IMPLANT
PACK CATARACT BRASINGTON (MISCELLANEOUS) ×3 IMPLANT
PACK EYE AFTER SURG (MISCELLANEOUS) ×3 IMPLANT
PACK OPTHALMIC (MISCELLANEOUS) ×3 IMPLANT
RING MALYGIN 7.0 (MISCELLANEOUS) IMPLANT
SUT ETHILON 10-0 CS-B-6CS-B-6 (SUTURE)
SUT VICRYL  9 0 (SUTURE)
SUT VICRYL 9 0 (SUTURE) IMPLANT
SUTURE EHLN 10-0 CS-B-6CS-B-6 (SUTURE) IMPLANT
SYR 3ML LL SCALE MARK (SYRINGE) ×3 IMPLANT
SYR 5ML LL (SYRINGE) ×3 IMPLANT
SYR TB 1ML LUER SLIP (SYRINGE) ×3 IMPLANT
WATER STERILE IRR 250ML POUR (IV SOLUTION) ×3 IMPLANT
WIPE NON LINTING 3.25X3.25 (MISCELLANEOUS) ×3 IMPLANT

## 2017-09-16 NOTE — Transfer of Care (Signed)
Immediate Anesthesia Transfer of Care Note  Patient: Jennifer Larsen  Procedure(s) Performed: CATARACT EXTRACTION PHACO AND INTRAOCULAR LENS PLACEMENT (IOC) LEFT COMPLICATED (Left Eye)  Patient Location: PACU  Anesthesia Type: MAC  Level of Consciousness: awake, alert  and patient cooperative  Airway and Oxygen Therapy: Patient Spontanous Breathing and Patient connected to supplemental oxygen  Post-op Assessment: Post-op Vital signs reviewed, Patient's Cardiovascular Status Stable, Respiratory Function Stable, Patent Airway and No signs of Nausea or vomiting  Post-op Vital Signs: Reviewed and stable  Complications: No apparent anesthesia complications

## 2017-09-16 NOTE — Op Note (Signed)
OPERATIVE NOTE  Jennifer Larsen [id number] 09/16/2017   PREOPERATIVE DIAGNOSIS:  Nuclear sclerotic cataract left eye. H25.12   POSTOPERATIVE DIAGNOSIS:    Nuclear sclerotic cataract left eye.     PROCEDURE:  Phacoemusification with posterior chamber intraocular lens placement of the left eye   LENS:   Implant Name Type Inv. Item Serial No. Manufacturer Lot No. LRB No. Used  LENS IOL DIOP 19.5 - [id number] Intraocular Lens LENS IOL DIOP 19.5 [id number]  Left 1        ULTRASOUND TIME: 17  % of 1 minutes 19 seconds, CDE 13.1  SURGEON:  Wyonia Hough, MD   ANESTHESIA:  Topical with tetracaine drops and 2% Xylocaine jelly, augmented with 1% preservative-free intracameral lidocaine.    COMPLICATIONS:  None.   DESCRIPTION OF PROCEDURE:  The patient was identified in the holding room and transported to the operating room and placed in the supine position under the operating microscope.  The left eye was identified as the operative eye and it was prepped and draped in the usual sterile ophthalmic fashion.   A 1 millimeter clear-corneal paracentesis was made at the 1:30 position.  0.5 ml of preservative-free 1% lidocaine was injected into the anterior chamber.  The anterior chamber was filled with Viscoat viscoelastic.  A 2.4 millimeter keratome was used to make a near-clear corneal incision at the 10:30 position.  .  A curvilinear capsulorrhexis was made with a cystotome and capsulorrhexis forceps.  Balanced salt solution was used to hydrodissect and hydrodelineate the nucleus.   Phacoemulsification was then used in stop and chop fashion to remove the lens nucleus and epinucleus.  The remaining cortex was then removed using the irrigation and aspiration handpiece. Provisc was then placed into the capsular bag to distend it for lens placement.  A lens was then injected into the capsular bag.  The remaining viscoelastic was aspirated.   Wounds were hydrated with balanced  salt solution.  The anterior chamber was inflated to a physiologic pressure with balanced salt solution.  No wound leaks were noted. Vigamox 0.2 ml of a 1mg  per ml solution was injected into the anterior chamber for a dose of 0.2 mg of intracameral antibiotic at the completion of the case.   Timolol and Brimonidine drops were applied to the eye.  The patient was taken to the recovery room in stable condition without complications of anesthesia or surgery.  [name] 09/16/2017, 11:57 AM

## 2017-09-16 NOTE — Anesthesia Postprocedure Evaluation (Signed)
Anesthesia Post Note  Patient: Jennifer Larsen  Procedure(s) Performed: CATARACT EXTRACTION PHACO AND INTRAOCULAR LENS PLACEMENT (IOC) LEFT COMPLICATED (Left Eye)  Patient location during evaluation: PACU Anesthesia Type: MAC Level of consciousness: awake and alert Pain management: pain level controlled Vital Signs Assessment: post-procedure vital signs reviewed and stable Respiratory status: spontaneous breathing Cardiovascular status: blood pressure returned to baseline Postop Assessment: no headache Anesthetic complications: no    Jaci Standard, III,  Matika Bartell D

## 2017-09-16 NOTE — Anesthesia Preprocedure Evaluation (Signed)
Anesthesia Evaluation  Patient identified by MRN, date of birth, ID band Patient awake    Reviewed: Allergy & Precautions, H&P , NPO status , Patient's Chart, lab work & pertinent test results  Airway Mallampati: II  TM Distance: >3 FB     Dental no notable dental hx.    Pulmonary neg pulmonary ROS,    Pulmonary exam normal        Cardiovascular hypertension, Normal cardiovascular exam  LBBB- denies CP/SOB, greater than 4 mets functional capacity.   Neuro/Psych    GI/Hepatic Neg liver ROS, Medicated,  Endo/Other  negative endocrine ROS  Renal/GU negative Renal ROS     Musculoskeletal   Abdominal   Peds  Hematology negative hematology ROS (+)   Anesthesia Other Findings   Reproductive/Obstetrics                            Anesthesia Physical Anesthesia Plan  ASA: II  Anesthesia Plan: MAC   Post-op Pain Management:    Induction:   PONV Risk Score and Plan:   Airway Management Planned:   Additional Equipment:   Intra-op Plan:   Post-operative Plan:   Informed Consent: I have reviewed the patients History and Physical, chart, labs and discussed the procedure including the risks, benefits and alternatives for the proposed anesthesia with the patient or authorized representative who has indicated his/her understanding and acceptance.     Plan Discussed with:   Anesthesia Plan Comments:         Anesthesia Quick Evaluation

## 2017-09-16 NOTE — H&P (Signed)
The History and Physical notes are on paper, have been signed, and are to be scanned. The patient remains stable and unchanged from the H&P.   Previous H&P reviewed, patient examined, and there are no changes.  Jennifer Larsen 09/16/2017 11:04 AM

## 2017-09-17 ENCOUNTER — Encounter: Payer: Self-pay | Admitting: Ophthalmology

## 2017-10-20 ENCOUNTER — Other Ambulatory Visit: Payer: Self-pay | Admitting: Student

## 2017-10-20 DIAGNOSIS — K449 Diaphragmatic hernia without obstruction or gangrene: Secondary | ICD-10-CM

## 2017-10-20 DIAGNOSIS — K219 Gastro-esophageal reflux disease without esophagitis: Secondary | ICD-10-CM

## 2017-11-10 ENCOUNTER — Ambulatory Visit
Admission: RE | Admit: 2017-11-10 | Discharge: 2017-11-10 | Disposition: A | Discharge status: Home / Self Care | Payer: Medicare Other | Source: Ambulatory Visit | Attending: Student | Admitting: Student

## 2017-11-10 DIAGNOSIS — K219 Gastro-esophageal reflux disease without esophagitis: Secondary | ICD-10-CM | POA: Insufficient documentation

## 2017-11-10 DIAGNOSIS — K449 Diaphragmatic hernia without obstruction or gangrene: Secondary | ICD-10-CM | POA: Diagnosis present

## 2018-04-02 ENCOUNTER — Encounter: Payer: Self-pay | Admitting: *Deleted

## 2018-04-05 ENCOUNTER — Encounter
Admission: RE | Disposition: A | Discharge status: Home / Self Care | Payer: Self-pay | Source: Ambulatory Visit | Attending: Unknown Physician Specialty

## 2018-04-05 ENCOUNTER — Ambulatory Visit: Payer: Medicare Other | Admitting: Anesthesiology

## 2018-04-05 ENCOUNTER — Ambulatory Visit
Admission: RE | Admit: 2018-04-05 | Discharge: 2018-04-05 | Disposition: A | Discharge status: Home / Self Care | Payer: Medicare Other | Source: Ambulatory Visit | Attending: Unknown Physician Specialty | Admitting: Unknown Physician Specialty

## 2018-04-05 DIAGNOSIS — Z888 Allergy status to other drugs, medicaments and biological substances status: Secondary | ICD-10-CM | POA: Diagnosis not present

## 2018-04-05 DIAGNOSIS — Z7982 Long term (current) use of aspirin: Secondary | ICD-10-CM | POA: Insufficient documentation

## 2018-04-05 DIAGNOSIS — Z79899 Other long term (current) drug therapy: Secondary | ICD-10-CM | POA: Insufficient documentation

## 2018-04-05 DIAGNOSIS — E039 Hypothyroidism, unspecified: Secondary | ICD-10-CM | POA: Diagnosis not present

## 2018-04-05 DIAGNOSIS — K219 Gastro-esophageal reflux disease without esophagitis: Secondary | ICD-10-CM | POA: Diagnosis not present

## 2018-04-05 DIAGNOSIS — R12 Heartburn: Secondary | ICD-10-CM | POA: Diagnosis present

## 2018-04-05 DIAGNOSIS — K2970 Gastritis, unspecified, without bleeding: Secondary | ICD-10-CM | POA: Diagnosis not present

## 2018-04-05 DIAGNOSIS — G709 Myoneural disorder, unspecified: Secondary | ICD-10-CM | POA: Insufficient documentation

## 2018-04-05 DIAGNOSIS — K449 Diaphragmatic hernia without obstruction or gangrene: Secondary | ICD-10-CM | POA: Insufficient documentation

## 2018-04-05 DIAGNOSIS — Z881 Allergy status to other antibiotic agents status: Secondary | ICD-10-CM | POA: Diagnosis not present

## 2018-04-05 DIAGNOSIS — I447 Left bundle-branch block, unspecified: Secondary | ICD-10-CM | POA: Diagnosis not present

## 2018-04-05 DIAGNOSIS — I252 Old myocardial infarction: Secondary | ICD-10-CM | POA: Insufficient documentation

## 2018-04-05 DIAGNOSIS — I10 Essential (primary) hypertension: Secondary | ICD-10-CM | POA: Insufficient documentation

## 2018-04-05 DIAGNOSIS — M19049 Primary osteoarthritis, unspecified hand: Secondary | ICD-10-CM | POA: Insufficient documentation

## 2018-04-05 HISTORY — DX: Prediabetes: R73.03

## 2018-04-05 HISTORY — DX: Diaphragmatic hernia without obstruction or gangrene: K44.9

## 2018-04-05 HISTORY — PX: ESOPHAGOGASTRODUODENOSCOPY (EGD) WITH PROPOFOL: SHX5813

## 2018-04-05 HISTORY — DX: Left bundle-branch block, unspecified: I44.7

## 2018-04-05 SURGERY — ESOPHAGOGASTRODUODENOSCOPY (EGD) WITH PROPOFOL
Anesthesia: General

## 2018-04-05 MED ORDER — FENTANYL CITRATE (PF) 100 MCG/2ML IJ SOLN
INTRAMUSCULAR | Status: DC | PRN
  Administered 2018-04-05 (×2): 25 ug via INTRAVENOUS

## 2018-04-05 MED ORDER — SODIUM CHLORIDE 0.9 % IV SOLN: INTRAVENOUS | Status: DC

## 2018-04-05 MED ORDER — GLYCOPYRROLATE 0.2 MG/ML IJ SOLN
INTRAMUSCULAR | Status: DC | PRN
  Administered 2018-04-05: 0.2 mg via INTRAVENOUS

## 2018-04-05 MED ORDER — GLYCOPYRROLATE 0.2 MG/ML IJ SOLN
INTRAMUSCULAR | Status: AC
  Filled 2018-04-05: qty 1

## 2018-04-05 MED ORDER — SODIUM CHLORIDE 0.9 % IV SOLN
INTRAVENOUS | Status: DC
  Administered 2018-04-05: 1000 mL via INTRAVENOUS

## 2018-04-05 MED ORDER — MIDAZOLAM HCL 2 MG/2ML IJ SOLN
INTRAMUSCULAR | Status: AC
  Filled 2018-04-05: qty 2

## 2018-04-05 MED ORDER — FENTANYL CITRATE (PF) 100 MCG/2ML IJ SOLN
INTRAMUSCULAR | Status: AC
  Filled 2018-04-05: qty 2

## 2018-04-05 MED ORDER — PROPOFOL 10 MG/ML IV BOLUS
INTRAVENOUS | Status: DC | PRN
  Administered 2018-04-05 (×2): 20 mg via INTRAVENOUS
  Administered 2018-04-05: 10 mg via INTRAVENOUS

## 2018-04-05 MED ORDER — LIDOCAINE HCL (PF) 1 % IJ SOLN
2.0000 mL | Freq: Once | INTRAMUSCULAR | Status: AC
  Administered 2018-04-05: 0.3 mL via INTRADERMAL

## 2018-04-05 MED ORDER — LIDOCAINE HCL (PF) 2 % IJ SOLN
INTRAMUSCULAR | Status: DC | PRN
  Administered 2018-04-05: 40 mg

## 2018-04-05 MED ORDER — PROPOFOL 500 MG/50ML IV EMUL
INTRAVENOUS | Status: DC | PRN
  Administered 2018-04-05: 30 ug/kg/min via INTRAVENOUS

## 2018-04-05 MED ORDER — MIDAZOLAM HCL 5 MG/5ML IJ SOLN
INTRAMUSCULAR | Status: DC | PRN
  Administered 2018-04-05: 1 mg via INTRAVENOUS

## 2018-04-05 MED ORDER — LIDOCAINE HCL (PF) 1 % IJ SOLN
INTRAMUSCULAR | Status: AC
  Administered 2018-04-05: 0.3 mL via INTRADERMAL
  Filled 2018-04-05: qty 2

## 2018-04-05 MED ORDER — PROPOFOL 500 MG/50ML IV EMUL
INTRAVENOUS | Status: AC
  Filled 2018-04-05: qty 50

## 2018-04-05 NOTE — Anesthesia Post-op Follow-up Note (Signed)
Anesthesia QCDR form completed.        

## 2018-04-05 NOTE — Op Note (Signed)
[facility] Gastroenterology Patient Name: Jennifer Larsen Procedure Date: 04/05/2018 9:08 AM MRN: [id number] Account #: [id number] Date of Birth: September 24, 1936 Admit Type: Outpatient Age: 82 Room: [facility] ENDO ROOM 3 Gender: Female Note Status: Finalized Procedure:            Upper GI endoscopy Indications:          Heartburn, Suspected gastro-esophageal reflux disease Providers:            Manya Silvas, MD Referring MD:         Glendon Axe (Referring MD) Medicines:            Propofol per Anesthesia Complications:        No immediate complications. Procedure:            Pre-Anesthesia Assessment:                       - After reviewing the risks and benefits, the patient                        was deemed in satisfactory condition to undergo the                        procedure.                       After obtaining informed consent, the endoscope was                        passed under direct vision. Throughout the procedure,                        the patient's blood pressure, pulse, and oxygen                        saturations were monitored continuously. The Endoscope                        was introduced through the mouth, and advanced to the                        second part of duodenum. The upper GI endoscopy was                        accomplished without difficulty. The patient tolerated                        the procedure well. Findings:      A small hiatal hernia was present. GEJ 38cm. Esophagus was normal.      Diffuse mildly erythematous mucosa without bleeding was found in the       gastric body and in the gastric antrum. Biopsies were taken with a cold       forceps for histology. Biopsies were taken with a cold forceps for       Helicobacter pylori testing.      The examined duodenum was normal. Impression:           - Small hiatal hernia.                       - Erythematous mucosa in the gastric body and antrum.   Biopsied.                       -  Normal examined duodenum. Recommendation:       - Await pathology results. Manya Silvas, MD 04/05/2018 9:47:56 AM This report has been signed electronically. Number of Addenda: 0 Note Initiated On: 04/05/2018 9:08 AM      [facility]

## 2018-04-05 NOTE — Anesthesia Postprocedure Evaluation (Signed)
Anesthesia Post Note  Patient: Jennifer Larsen  Procedure(s) Performed: ESOPHAGOGASTRODUODENOSCOPY (EGD) WITH PROPOFOL (N/A )  Patient location during evaluation: PACU Anesthesia Type: General Level of consciousness: awake and alert Pain management: pain level controlled Vital Signs Assessment: post-procedure vital signs reviewed and stable Respiratory status: spontaneous breathing, nonlabored ventilation, respiratory function stable and patient connected to nasal cannula oxygen Cardiovascular status: blood pressure returned to baseline and stable Postop Assessment: no apparent nausea or vomiting Anesthetic complications: no     Last Vitals:  Vitals:   04/05/18 1009 04/05/18 1010  BP:  (!) 153/75  Pulse:  65  Resp:  (!) 9  Temp: (!) 36.2 C   SpO2:  100%    Last Pain:  Vitals:   04/05/18 1009  TempSrc: Tympanic  PainSc: 0-No pain                 Molli Barrows

## 2018-04-05 NOTE — Transfer of Care (Signed)
Immediate Anesthesia Transfer of Care Note  Patient: Jennifer Larsen  Procedure(s) Performed: ESOPHAGOGASTRODUODENOSCOPY (EGD) WITH PROPOFOL (N/A )  Patient Location: PACU  Anesthesia Type:General  Level of Consciousness: sedated  Airway & Oxygen Therapy: Patient Spontanous Breathing and Patient connected to nasal cannula oxygen  Post-op Assessment: Report given to RN and Post -op Vital signs reviewed and stable  Post vital signs: Reviewed and stable  Last Vitals:  Vitals Value Taken Time  BP 140/73 04/05/2018  9:47 AM  Temp 36.2 C 04/05/2018  9:40 AM  Pulse 72 04/05/2018  9:48 AM  Resp 13 04/05/2018  9:48 AM  SpO2 99 % 04/05/2018  9:48 AM  Vitals shown include unvalidated device data.  Last Pain:  Vitals:   04/05/18 0940  TempSrc: Tympanic  PainSc: Asleep         Complications: No apparent anesthesia complications

## 2018-04-05 NOTE — H&P (Signed)
Primary Care Physician:  Glendon Axe, MD Primary Gastroenterologist:  Dr. Vira Agar  Pre-Procedure History & Physical: HPI:  Jennifer Larsen is a 82 y.o. female is here for an endoscopy. Done for GERD and chest pain   Past Medical History:  Diagnosis Date  . Arthritis    fingers  . Dysrhythmia 03/2017   left bundle branch block  . GERD (gastroesophageal reflux disease)   . Hard of hearing   . Hiatal hernia   . Hypertension   . Hypothyroidism   . LBBB (left bundle branch block)   . Myocardial infarction ([place])    ekg shows left bundle branch block per dr. Ubaldo Glassing, NO heart attack  . Pre-diabetes   . Thyroid disease   . Wears dentures    full upper, implants on bottom    Past Surgical History:  Procedure Laterality Date  . ABDOMINAL HYSTERECTOMY    . BREAST CYST ASPIRATION Left 03/24/2014  . BUNIONECTOMY    . CATARACT EXTRACTION W/PHACO Left 09/16/2017   Procedure: CATARACT EXTRACTION PHACO AND INTRAOCULAR LENS PLACEMENT ([place]) LEFT COMPLICATED;  Surgeon: Leandrew Koyanagi, MD;  Location: [place];  Service: Ophthalmology;  Laterality: Left;  . DENTAL SURGERY     implants on lower teeth  . OPEN REDUCTION INTERNAL FIXATION (ORIF) DISTAL RADIAL FRACTURE Left 03/17/2017   Procedure: OPEN REDUCTION INTERNAL FIXATION (ORIF) DISTAL RADIAL FRACTURE;  Surgeon: Hessie Knows, MD;  Location: [facility];  Service: Orthopedics;  Laterality: Left;    Prior to Admission medications   Medication Sig Start Date End Date Taking? Authorizing Provider  aspirin 81 MG chewable tablet Chew 40.5 mg by mouth daily. Takes 1/2 tab   Yes [provider]  Calcium Carb-Cholecalciferol (CALCIUM CARBONATE-VITAMIN D3 PO) Take by mouth.   Yes [provider]  amLODipine (NORVASC) 2.5 MG tablet Take 1 tablet by mouth daily. 10/24/16   [provider]  Biotin 10000 MCG TBDP Take 5,000 mcg by mouth daily.    [provider]  Cholecalciferol 1000 units tablet  Take 1,000 Units by mouth daily.    [provider]  Coenzyme Q10 (COQ10 PO) Take 1 tablet by mouth daily.    [provider]  Glucosamine HCl 1000 MG TABS Take 1,000 mg by mouth daily.    [provider]  lansoprazole (PREVACID) 30 MG capsule Take 1 capsule by mouth daily. 09/29/16   [provider]  Magnesium 250 MG TABS Take 250 mg by mouth daily.    [provider]  Multiple Vitamin (MULTI-VITAMINS) TABS Take 1 tablet by mouth daily.    [provider]  oxyCODONE (OXY IR/ROXICODONE) 5 MG immediate release tablet Take 1-2 tablets (5-10 mg total) by mouth every 3 (three) hours as needed for breakthrough pain. Patient not taking: Reported on 09/14/2017 03/18/17   Duanne Guess, PA-C  SYNTHROID 100 MCG tablet Take 1 tablet by mouth daily. 10/24/16   [provider]  thiamine (VITAMIN B-1) 100 MG tablet Take 100 mg by mouth daily.    [provider]  vitamin B-12 (CYANOCOBALAMIN) 250 MCG tablet Take 250 mcg by mouth daily.    [provider]    Allergies as of 02/17/2018 - Review Complete 09/16/2017  Allergen Reaction Noted  . Cephalexin Rash 03/28/2015  . Clarithromycin Other (See Comments) 03/28/2015  . Macrolides and ketolides Other (See Comments) 05/09/2014  . Ofloxacin Other (See Comments) 03/28/2015    Family History  Problem Relation Age of Onset  . Breast cancer  Mother 72  . Breast cancer Maternal Aunt 44    Social History   Socioeconomic History  . Marital status: Divorced    Spouse name: Not on file  . Number of children: Not on file  . Years of education: Not on file  . Highest education level: Not on file  Occupational History  . Not on file  Social Needs  . Financial resource strain: Not on file  . Food insecurity:    Worry: Not on file    Inability: Not on file  . Transportation needs:    Medical: Not on file    Non-medical: Not on file  Tobacco Use  . Smoking status: Never  Smoker  . Smokeless tobacco: Never Used  Substance and Sexual Activity  . Alcohol use: No    Comment: occasional social drink, about once a year  . Drug use: No  . Sexual activity: Not on file  Lifestyle  . Physical activity:    Days per week: Not on file    Minutes per session: Not on file  . Stress: Not on file  Relationships  . Social connections:    Talks on phone: Not on file    Gets together: Not on file    Attends religious service: Not on file    Active member of club or organization: Not on file    Attends meetings of clubs or organizations: Not on file    Relationship status: Not on file  . Intimate partner violence:    Fear of current or ex partner: Not on file    Emotionally abused: Not on file    Physically abused: Not on file    Forced sexual activity: Not on file  Other Topics Concern  . Not on file  Social History Narrative  . Not on file    Review of Systems: See HPI, otherwise negative ROS  Physical Exam: BP (!) 174/65   Pulse 63   Temp (!) 97 F (36.1 C) (Tympanic)   Resp 17   Ht 5' (1.524 m)   Wt 58.1 kg (128 lb)   SpO2 99%   BMI 25.00 kg/m  General:   Alert,  pleasant and cooperative in NAD Head:  Normocephalic and atraumatic. Neck:  Supple; no masses or thyromegaly. Lungs:  Clear throughout to auscultation.    Heart:  Regular rate and rhythm. Abdomen:  Soft, nontender and nondistended. Normal bowel sounds, without guarding, and without rebound.   Neurologic:  Alert and  oriented x4;  grossly normal neurologically.  Impression/Plan: Irma Newness is here for an endoscopy to be performed for [facility] and Chest pain.  Risks, benefits, limitations, and alternatives regarding  endoscopy have been reviewed with the patient.  Questions have been answered.  All parties agreeable.   Gaylyn Cheers, MD  04/05/2018, 9:28 AM

## 2018-04-05 NOTE — Anesthesia Preprocedure Evaluation (Signed)
Anesthesia Evaluation  Patient identified by MRN, date of birth, ID band Patient awake    Reviewed: Allergy & Precautions, H&P , NPO status , Patient's Chart, lab work & pertinent test results, reviewed documented beta blocker date and time   Airway Mallampati: II   Neck ROM: full    Dental  (+) Poor Dentition   Pulmonary neg pulmonary ROS,    Pulmonary exam normal        Cardiovascular hypertension, + Past MI  negative cardio ROS Normal cardiovascular exam+ dysrhythmias  Rhythm:regular Rate:Normal     Neuro/Psych  Neuromuscular disease negative neurological ROS  negative psych ROS   GI/Hepatic negative GI ROS, Neg liver ROS, hiatal hernia, GERD  ,  Endo/Other  negative endocrine ROSHypothyroidism   Renal/GU negative Renal ROS  negative genitourinary   Musculoskeletal   Abdominal   Peds  Hematology negative hematology ROS (+)   Anesthesia Other Findings Past Medical History: No date: Arthritis     Comment:  fingers 03/2017: Dysrhythmia     Comment:  left bundle branch block No date: GERD (gastroesophageal reflux disease) No date: Hard of hearing No date: Hiatal hernia No date: Hypertension No date: Hypothyroidism No date: LBBB (left bundle branch block) No date: Myocardial infarction ([facility])     Comment:  ekg shows left bundle branch block per dr. Ubaldo Glassing, NO               heart attack No date: Pre-diabetes No date: Thyroid disease No date: Wears dentures     Comment:  full upper, implants on bottom Past Surgical History: No date: ABDOMINAL HYSTERECTOMY 03/24/2014: BREAST CYST ASPIRATION; Left No date: BUNIONECTOMY 09/16/2017: CATARACT EXTRACTION W/PHACO; Left     Comment:  Procedure: CATARACT EXTRACTION PHACO AND INTRAOCULAR               LENS PLACEMENT ([place]) LEFT COMPLICATED;  Surgeon:               Leandrew Koyanagi, MD;  Location: [place];              Service: Ophthalmology;   Laterality: Left; No date: DENTAL SURGERY     Comment:  implants on lower teeth 03/17/2017: OPEN REDUCTION INTERNAL FIXATION (ORIF) DISTAL RADIAL  FRACTURE; Left     Comment:  Procedure: OPEN REDUCTION INTERNAL FIXATION (ORIF)               DISTAL RADIAL FRACTURE;  Surgeon: Hessie Knows, MD;                Location: [facility];  Service: Orthopedics;  Laterality:               Left; BMI    Body Mass Index:  25.00 kg/m     Reproductive/Obstetrics negative OB ROS                             Anesthesia Physical Anesthesia Plan  ASA: III  Anesthesia Plan: General   Post-op Pain Management:    Induction:   PONV Risk Score and Plan:   Airway Management Planned:   Additional Equipment:   Intra-op Plan:   Post-operative Plan:   Informed Consent: I have reviewed the patients History and Physical, chart, labs and discussed the procedure including the risks, benefits and alternatives for the proposed anesthesia with the patient or authorized representative who has indicated his/her understanding and acceptance.   Dental Advisory Given  Plan Discussed  with: CRNA  Anesthesia Plan Comments:         Anesthesia Quick Evaluation

## 2018-04-06 LAB — SURGICAL PATHOLOGY

## 2018-04-08 ENCOUNTER — Encounter: Payer: Self-pay | Admitting: Unknown Physician Specialty

## 2018-04-20 ENCOUNTER — Other Ambulatory Visit: Payer: Self-pay | Admitting: Internal Medicine

## 2018-04-20 DIAGNOSIS — Z1231 Encounter for screening mammogram for malignant neoplasm of breast: Secondary | ICD-10-CM

## 2018-06-01 ENCOUNTER — Ambulatory Visit
Admission: RE | Admit: 2018-06-01 | Discharge: 2018-06-01 | Disposition: A | Discharge status: Home / Self Care | Payer: Medicare Other | Source: Ambulatory Visit | Attending: Internal Medicine | Admitting: Internal Medicine

## 2018-06-01 DIAGNOSIS — Z1231 Encounter for screening mammogram for malignant neoplasm of breast: Secondary | ICD-10-CM | POA: Insufficient documentation

## 2018-10-28 ENCOUNTER — Other Ambulatory Visit: Payer: Self-pay

## 2018-10-28 ENCOUNTER — Emergency Department
Admission: EM | Admit: 2018-10-28 | Discharge: 2018-10-28 | Disposition: A | Discharge status: Home / Self Care | Payer: Medicare Other | Attending: Emergency Medicine | Admitting: Emergency Medicine

## 2018-10-28 ENCOUNTER — Emergency Department: Payer: Medicare Other

## 2018-10-28 DIAGNOSIS — E039 Hypothyroidism, unspecified: Secondary | ICD-10-CM | POA: Insufficient documentation

## 2018-10-28 DIAGNOSIS — R110 Nausea: Secondary | ICD-10-CM | POA: Diagnosis present

## 2018-10-28 DIAGNOSIS — M1612 Unilateral primary osteoarthritis, left hip: Secondary | ICD-10-CM | POA: Diagnosis not present

## 2018-10-28 DIAGNOSIS — R7303 Prediabetes: Secondary | ICD-10-CM | POA: Diagnosis not present

## 2018-10-28 DIAGNOSIS — Z79899 Other long term (current) drug therapy: Secondary | ICD-10-CM | POA: Insufficient documentation

## 2018-10-28 DIAGNOSIS — M25552 Pain in left hip: Secondary | ICD-10-CM | POA: Diagnosis not present

## 2018-10-28 DIAGNOSIS — I10 Essential (primary) hypertension: Secondary | ICD-10-CM | POA: Insufficient documentation

## 2018-10-28 MED ORDER — LIDOCAINE 5 % EX PTCH
1.0000 | MEDICATED_PATCH | CUTANEOUS | Status: DC
  Administered 2018-10-28: 1 via TRANSDERMAL
  Filled 2018-10-28: qty 1

## 2018-10-28 MED ORDER — LIDOCAINE 5 % EX PTCH: 1.0000 | MEDICATED_PATCH | Freq: Two times a day (BID) | CUTANEOUS | 0 refills | Status: AC

## 2018-10-28 MED ORDER — ONDANSETRON 8 MG PO TBDP
8.0000 mg | ORAL_TABLET | Freq: Once | ORAL | Status: AC
  Administered 2018-10-28: 8 mg via ORAL
  Filled 2018-10-28: qty 1

## 2018-10-28 NOTE — ED Triage Notes (Signed)
Pt arrives to ED via POV from home with c/o left hip pain since Sunday. No recent injury, fall, or trauma. Pt also c/o nausea x1.5 hrs. Pt denies vomiting. Pt denies CP or SHOB; no fever, cough, diarrhea, or abdominal pain reported.

## 2018-10-28 NOTE — ED Provider Notes (Signed)
[facility] Emergency Department Provider Note   ____________________________________________   First MD Initiated Contact with Patient 10/28/18 [phone]     (approximate)  I have reviewed the triage vital signs and the nursing notes.   HISTORY  Chief Complaint Nausea and Hip Pain    HPI Jennifer Larsen is a 82 y.o. female patient complain of 4 days of left hip pain.  Patient denies any provocative incident for complaint.  Patient also state awakened with nausea this morning.  Patient denies vomiting or diarrhea.  Patient denies URI signs symptoms.  Patient rates hip pain at the 6/10.  Patient described the pain is "achy".  Patient is able to ambulate.  Patient has a history of arthritis.  Past Medical History:  Diagnosis Date  . Arthritis    fingers  . Dysrhythmia 03/2017   left bundle branch block  . GERD (gastroesophageal reflux disease)   . Hard of hearing   . Hiatal hernia   . Hypertension   . Hypothyroidism   . LBBB (left bundle branch block)   . Myocardial infarction ([place])    ekg shows left bundle branch block per dr. Ubaldo Glassing, NO heart attack  . Pre-diabetes   . Thyroid disease   . Wears dentures    full upper, implants on bottom    Patient Active Problem List   Diagnosis Date Noted  . Distal radius fracture 03/17/2017    Past Surgical History:  Procedure Laterality Date  . ABDOMINAL HYSTERECTOMY    . BREAST CYST ASPIRATION Left 03/24/2014  . BUNIONECTOMY    . CATARACT EXTRACTION W/PHACO Left 09/16/2017   Procedure: CATARACT EXTRACTION PHACO AND INTRAOCULAR LENS PLACEMENT ([place]) LEFT COMPLICATED;  Surgeon: Leandrew Koyanagi, MD;  Location: [place];  Service: Ophthalmology;  Laterality: Left;  . DENTAL SURGERY     implants on lower teeth  . ESOPHAGOGASTRODUODENOSCOPY (EGD) WITH PROPOFOL N/A 04/05/2018   Procedure: ESOPHAGOGASTRODUODENOSCOPY (EGD) WITH PROPOFOL;  Surgeon: Manya Silvas, MD;  Location: [facility];   Service: Endoscopy;  Laterality: N/A;  . OPEN REDUCTION INTERNAL FIXATION (ORIF) DISTAL RADIAL FRACTURE Left 03/17/2017   Procedure: OPEN REDUCTION INTERNAL FIXATION (ORIF) DISTAL RADIAL FRACTURE;  Surgeon: Hessie Knows, MD;  Location: [facility];  Service: Orthopedics;  Laterality: Left;    Prior to Admission medications   Medication Sig Start Date End Date Taking? Authorizing Provider  amLODipine (NORVASC) 2.5 MG tablet Take 1 tablet by mouth daily. 10/24/16   [provider]  aspirin 81 MG chewable tablet Chew 40.5 mg by mouth daily. Takes 1/2 tab    [provider]  Biotin 10000 MCG TBDP Take 5,000 mcg by mouth daily.    [provider]  Calcium Carb-Cholecalciferol (CALCIUM CARBONATE-VITAMIN D3 PO) Take by mouth.    [provider]  Cholecalciferol 1000 units tablet Take 1,000 Units by mouth daily.    [provider]  Coenzyme Q10 (COQ10 PO) Take 1 tablet by mouth daily.    [provider]  Glucosamine HCl 1000 MG TABS Take 1,000 mg by mouth daily.    [provider]  lansoprazole (PREVACID) 30 MG capsule Take 1 capsule by mouth daily. 09/29/16   [provider]  lidocaine (LIDODERM) 5 % Place 1 patch onto the skin every 12 (twelve) hours. Remove & Discard patch within 12 hours or as directed by MD 10/28/18 10/28/19  Sable Feil, PA-C  Magnesium 250 MG TABS Take 250 mg by mouth daily.    [provider]  Multiple Vitamin (MULTI-VITAMINS) TABS Take 1 tablet by mouth daily.    [provider]  oxyCODONE (OXY IR/ROXICODONE) 5 MG immediate release tablet Take 1-2 tablets (5-10 mg total) by mouth every 3 (three) hours as needed for breakthrough pain. Patient not taking: Reported on 09/14/2017 03/18/17   Duanne Guess, PA-C  SYNTHROID 100 MCG tablet Take 1 tablet by mouth daily. 10/24/16   [provider]  thiamine (VITAMIN B-1) 100 MG tablet Take 100 mg by mouth daily.    [provider]  vitamin B-12 (CYANOCOBALAMIN) 250 MCG tablet Take 250 mcg by mouth daily.    [provider]    Allergies Cephalexin; Clarithromycin; Macrolides and ketolides; and Ofloxacin  Family History  Problem Relation Age of Onset  . Breast cancer Mother 38  . Breast cancer Maternal Aunt 86    Social History Social History   Tobacco Use  . Smoking status: Never Smoker  . Smokeless tobacco: Never Used  Substance Use Topics  . Alcohol use: No    Comment: occasional social drink, about once a year  . Drug use: No    Review of Systems Constitutional: No fever/chills Eyes: No visual changes. ENT: No sore throat. Cardiovascular: Denies chest pain. Respiratory: Denies shortness of breath. Gastrointestinal: No abdominal pain.  No nausea, no vomiting.  No diarrhea.  No constipation. Genitourinary: Negative for dysuria. Musculoskeletal: Left hip pain.   Skin: Negative for rash. Neurological: Negative for headaches, focal weakness or numbness. Endocrine:Hypertension hypothyroidism.  Prediabetic. Allergic/Immunilogical: See medication list. ____________________________________________   PHYSICAL EXAM:  VITAL SIGNS: ED Triage Vitals  Enc Vitals Group     BP 10/28/18 0615 (!) 179/73     Pulse Rate 10/28/18 0615 65     Resp --      Temp 10/28/18 0615 98.1 F (36.7 C)     Temp Source 10/28/18 0615 Oral     SpO2 10/28/18 0615 95 %     Weight 10/28/18 0615 135 lb (61.2 kg)     Height 10/28/18 0615 5' (1.524 m)     Head Circumference --      Peak Flow --      Pain Score 10/28/18 0621 6     Pain Loc --      Pain Edu? --      Excl. in [place]? --    Constitutional: Alert and oriented. Well appearing and in no acute distress. Cardiovascular: Normal rate, regular rhythm. Grossly normal heart sounds.  Good peripheral circulation.  Elevated blood pressure. Respiratory: Normal respiratory effort.  No retractions. Lungs CTAB. Musculoskeletal normal obvious leg length discrepancy.   Patient has moderate guarding palpation to greater trochanter.  Patient has full equal range of motion in supine position. Neurologic:  Normal speech and language. No gross focal neurologic deficits are appreciated. No gait instability. Skin:  Skin is warm, dry and intact. No rash noted. Psychiatric: Mood and affect are normal. Speech and behavior are normal.  ____________________________________________   LABS (all labs ordered are listed, but only abnormal results are displayed)  Labs Reviewed - No data to display ____________________________________________  EKG  ____________________________________________  RADIOLOGY  ED MD interpretation:    Official radiology report(s): Dg Hip Unilat With Pelvis 2-3 Views Left  Result Date: 10/28/2018 CLINICAL DATA:  Left hip pain 5 days.  No injury EXAM: DG HIP (WITH OR WITHOUT PELVIS) 2-3V LEFT COMPARISON:  None. FINDINGS: There is no evidence of hip fracture or dislocation. Mild chronic soft tissue calcification  lateral to the greater trochanter. There is no evidence of arthropathy or other focal bone abnormality. IMPRESSION: No acute abnormality. Electronically Signed   By: Franchot Gallo M.D.   On: 10/28/2018 07:24    ____________________________________________   PROCEDURES  Procedure(s) performed: None  Procedures  Critical Care performed: No  ____________________________________________   INITIAL IMPRESSION / ASSESSMENT AND PLAN / ED COURSE  As part of my medical decision making, I reviewed the following data within the electronic MEDICAL RECORD NUMBER    Left hip pain secondary to degenerative changes.  Discussed x-ray findings with patient.  Patient given discharge care instruction and prescription for Lidoderm patch.  Patient advised follow-up PCP for continued care.      ____________________________________________   FINAL CLINICAL IMPRESSION(S) / ED DIAGNOSES  Final diagnoses:  Arthritis of left hip     ED  Discharge Orders         Ordered    lidocaine (LIDODERM) 5 %  Every 12 hours     10/28/18 0758           Note:  This document was prepared using Dragon voice recognition software and may include unintentional dictation errors.    Sable Feil, PA-C 10/28/18 0802    Earleen Newport, MD 10/28/18 [phone]

## 2018-10-28 NOTE — ED Notes (Signed)
See triage note  Presents with left hip pain  States she did not have any injury   And pain does not radiate  Pain is mainly in lower back and hip area

## 2019-03-30 ENCOUNTER — Other Ambulatory Visit (HOSPITAL_COMMUNITY): Payer: Self-pay | Admitting: Physician Assistant

## 2019-03-30 ENCOUNTER — Other Ambulatory Visit: Payer: Self-pay | Admitting: Physician Assistant

## 2019-03-30 DIAGNOSIS — G4485 Primary stabbing headache: Secondary | ICD-10-CM

## 2019-03-30 DIAGNOSIS — R519 Headache, unspecified: Secondary | ICD-10-CM

## 2019-04-01 ENCOUNTER — Other Ambulatory Visit: Payer: Self-pay

## 2019-04-01 ENCOUNTER — Other Ambulatory Visit: Payer: Self-pay | Admitting: Physician Assistant

## 2019-04-01 ENCOUNTER — Ambulatory Visit
Admission: RE | Admit: 2019-04-01 | Discharge: 2019-04-01 | Disposition: A | Discharge status: Home / Self Care | Payer: Medicare Other | Source: Ambulatory Visit | Attending: Physician Assistant | Admitting: Physician Assistant

## 2019-04-01 DIAGNOSIS — R519 Headache, unspecified: Secondary | ICD-10-CM

## 2019-04-01 DIAGNOSIS — R51 Headache: Secondary | ICD-10-CM | POA: Insufficient documentation

## 2019-04-01 DIAGNOSIS — G4485 Primary stabbing headache: Secondary | ICD-10-CM | POA: Diagnosis not present

## 2019-04-01 MED ORDER — GADOBUTROL 1 MMOL/ML IV SOLN
6.0000 mL | Freq: Once | INTRAVENOUS | Status: AC | PRN
  Administered 2019-04-01: 6 mL via INTRAVENOUS

## 2019-05-02 ENCOUNTER — Other Ambulatory Visit: Payer: Self-pay | Admitting: Internal Medicine

## 2019-05-02 DIAGNOSIS — Z1231 Encounter for screening mammogram for malignant neoplasm of breast: Secondary | ICD-10-CM

## 2019-06-08 ENCOUNTER — Ambulatory Visit
Admission: RE | Admit: 2019-06-08 | Discharge: 2019-06-08 | Disposition: A | Discharge status: Home / Self Care | Payer: Medicare Other | Source: Ambulatory Visit | Attending: Internal Medicine | Admitting: Internal Medicine

## 2019-06-08 DIAGNOSIS — Z1231 Encounter for screening mammogram for malignant neoplasm of breast: Secondary | ICD-10-CM | POA: Diagnosis present

## 2019-11-10 DIAGNOSIS — K219 Gastro-esophageal reflux disease without esophagitis: Secondary | ICD-10-CM | POA: Diagnosis not present

## 2019-11-10 DIAGNOSIS — H6123 Impacted cerumen, bilateral: Secondary | ICD-10-CM | POA: Diagnosis not present

## 2019-11-21 ENCOUNTER — Ambulatory Visit: Payer: PPO | Attending: Internal Medicine

## 2019-11-21 DIAGNOSIS — Z20822 Contact with and (suspected) exposure to covid-19: Secondary | ICD-10-CM

## 2019-11-22 LAB — NOVEL CORONAVIRUS, NAA: SARS-CoV-2, NAA: NOT DETECTED

## 2019-11-24 DIAGNOSIS — J029 Acute pharyngitis, unspecified: Secondary | ICD-10-CM | POA: Diagnosis not present

## 2019-12-12 ENCOUNTER — Ambulatory Visit (INDEPENDENT_AMBULATORY_CARE_PROVIDER_SITE_OTHER): Payer: PPO | Admitting: Otolaryngology

## 2019-12-12 ENCOUNTER — Other Ambulatory Visit: Payer: Self-pay

## 2019-12-12 VITALS — Temp 97.9°F

## 2019-12-12 DIAGNOSIS — K219 Gastro-esophageal reflux disease without esophagitis: Secondary | ICD-10-CM | POA: Diagnosis not present

## 2019-12-12 NOTE — Progress Notes (Signed)
HPI: Jennifer Larsen is a 84 y.o. female who presents is referred by her PCP for evaluation of chronic complaints of intermittent throat soreness that comes and goes.  She describes an aching or burning sensation lower in the throat and upper chest in the region of the cricoid cartilage and below.  She has used proton pump inhibitors for a long time and has just recently had this increased to twice daily.  She has had previous history of GERD.  She is not having any difficulty swallowing presently.  She states that the symptoms tend to come and go.  She is doing well today with no burning sensation today.  She is having no hoarseness. She does not smoke.  Past Medical History:  Diagnosis Date  . Arthritis    fingers  . Dysrhythmia 03/2017   left bundle branch block  . GERD (gastroesophageal reflux disease)   . Hard of hearing   . Hiatal hernia   . Hypertension   . Hypothyroidism   . LBBB (left bundle branch block)   . Myocardial infarction ([place])    ekg shows left bundle branch block per dr. Ubaldo Glassing, NO heart attack  . Pre-diabetes   . Thyroid disease   . Wears dentures    full upper, implants on bottom   Past Surgical History:  Procedure Laterality Date  . ABDOMINAL HYSTERECTOMY    . BREAST CYST ASPIRATION Left 03/24/2014  . BUNIONECTOMY    . CATARACT EXTRACTION W/PHACO Left 09/16/2017   Procedure: CATARACT EXTRACTION PHACO AND INTRAOCULAR LENS PLACEMENT ([place]) LEFT COMPLICATED;  Surgeon: Leandrew Koyanagi, MD;  Location: [place];  Service: Ophthalmology;  Laterality: Left;  . DENTAL SURGERY     implants on lower teeth  . ESOPHAGOGASTRODUODENOSCOPY (EGD) WITH PROPOFOL N/A 04/05/2018   Procedure: ESOPHAGOGASTRODUODENOSCOPY (EGD) WITH PROPOFOL;  Surgeon: Manya Silvas, MD;  [facility];  Service: Endoscopy;  Laterality: N/A;  . OPEN REDUCTION INTERNAL FIXATION (ORIF) DISTAL RADIAL FRACTURE Left 03/17/2017   Procedure: OPEN REDUCTION INTERNAL FIXATION  (ORIF) DISTAL RADIAL FRACTURE;  Surgeon: Hessie Knows, MD;  Location: [facility];  Service: Orthopedics;  Laterality: Left;   Social History   Socioeconomic History  . Marital status: Divorced    Spouse name: Not on file  . Number of children: Not on file  . Years of education: Not on file  . Highest education level: Not on file  Occupational History  . Not on file  Tobacco Use  . Smoking status: Never Smoker  . Smokeless tobacco: Never Used  Substance and Sexual Activity  . Alcohol use: No    Comment: occasional social drink, about once a year  . Drug use: No  . Sexual activity: Not on file  Other Topics Concern  . Not on file  Social History Narrative  . Not on file   Social Determinants of Health   Financial Resource Strain:   . Difficulty of Paying Living Expenses: Not on file  Food Insecurity:   . Worried About Charity fundraiser in the Last Year: Not on file  . Ran Out of Food in the Last Year: Not on file  Transportation Needs:   . Lack of Transportation (Medical): Not on file  . Lack of Transportation (Non-Medical): Not on file  Physical Activity:   . Days of Exercise per Week: Not on file  . Minutes of Exercise per Session: Not on file  Stress:   . Feeling of Stress : Not on file  Social  Connections:   . Frequency of Communication with Friends and Family: Not on file  . Frequency of Social Gatherings with Friends and Family: Not on file  . Attends Religious Services: Not on file  . Active Member of Clubs or Organizations: Not on file  . Attends Archivist Meetings: Not on file  . Marital Status: Not on file   Family History  Problem Relation Age of Onset  . Breast cancer Mother 62  . Breast cancer Maternal Aunt 50   Allergies  Allergen Reactions  . Cephalexin Rash  . Clarithromycin Other (See Comments)    Unknown   . Macrolides And Ketolides Other (See Comments)    Mouth ulcers  . Ofloxacin Other (See Comments)    Unknown    Prior to  Admission medications   Medication Sig Start Date End Date Taking? Authorizing Provider  amLODipine (NORVASC) 2.5 MG tablet Take 1 tablet by mouth daily. 10/24/16  Yes [provider]  aspirin 81 MG chewable tablet Chew 40.5 mg by mouth daily. Takes 1/2 tab   Yes [provider]  Biotin 10000 MCG TBDP Take 5,000 mcg by mouth daily.   Yes [provider]  Calcium Carb-Cholecalciferol (CALCIUM CARBONATE-VITAMIN D3 PO) Take by mouth.   Yes [provider]  Cholecalciferol 1000 units tablet Take 1,000 Units by mouth daily.   Yes [provider]  Coenzyme Q10 (COQ10 PO) Take 1 tablet by mouth daily.   Yes [provider]  Glucosamine HCl 1000 MG TABS Take 1,000 mg by mouth daily.   Yes [provider]  lansoprazole (PREVACID) 30 MG capsule Take 1 capsule by mouth daily. 09/29/16  Yes [provider]  Magnesium 250 MG TABS Take 250 mg by mouth daily.   Yes [provider]  Multiple Vitamin (MULTI-VITAMINS) TABS Take 1 tablet by mouth daily.   Yes [provider]  oxyCODONE (OXY IR/ROXICODONE) 5 MG immediate release tablet Take 1-2 tablets (5-10 mg total) by mouth every 3 (three) hours as needed for breakthrough pain. 03/18/17  Yes Dorise Hiss C, PA-C  SYNTHROID 100 MCG tablet Take 1 tablet by mouth daily. 10/24/16  Yes [provider]  thiamine (VITAMIN B-1) 100 MG tablet Take 100 mg by mouth daily.   Yes [provider]  vitamin B-12 (CYANOCOBALAMIN) 250 MCG tablet Take 250 mcg by mouth daily.   Yes [provider]     Positive ROS: Otherwise negative  All other systems have been reviewed and were otherwise negative with the exception of those mentioned in the HPI and as above.  Physical Exam: Constitutional: Alert, well-appearing, no acute distress.  She is having no hoarseness. Ears: External ears without lesions or tenderness. Ear canals are clear bilaterally with intact,  clear TMs.  Nasal: External nose without lesions. Septum is relatively midline.. Clear nasal passages Oral: Lips and gums without lesions. Tongue and palate mucosa without lesions. Posterior oropharynx clear. Fiberoptic laryngoscopy was performed to the right nostril.  The nasopharynx was clear.  The base of tongue vallecula and epiglottis were normal.  Both piriform sinuses were clear.  Vocal cords were normal with normal vocal cord mobility.  She had mild arytenoid edema but minimal erythema and no abnormalities noted. Neck: No palpable adenopathy or masses.  Thyroid region benign to palpation. Respiratory: Breathing comfortably  Skin: No facial/neck lesions or rash noted.  Laryngoscopy  Date/Time: 12/12/2019 6:30 PM Performed by: Rozetta Nunnery, MD Authorized by: Rozetta Nunnery, MD  Consent:    Consent obtained:  Verbal   Consent given by:  Patient   Risks discussed:  Pain Procedure details:    Indications: direct visualization of the upper aerodigestive tract     Medication:  Afrin   Scope location: right nare   Sinus:    Right nasopharynx: normal   Mouth:    Vallecula: normal     Base of tongue: normal     Epiglottis: normal   Throat:    True vocal cords: normal   Comments:     Clear upper airway examination on fiberoptic laryngoscopy.    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 Reassured her of normal upper airway examination. If symptoms persist would recommend upper GI endoscopy with gastroenterology.   Radene Journey, MD   CC:

## 2020-02-14 DIAGNOSIS — R413 Other amnesia: Secondary | ICD-10-CM | POA: Diagnosis not present

## 2020-02-14 DIAGNOSIS — H9190 Unspecified hearing loss, unspecified ear: Secondary | ICD-10-CM | POA: Diagnosis not present

## 2020-02-21 DIAGNOSIS — H35373 Puckering of macula, bilateral: Secondary | ICD-10-CM | POA: Diagnosis not present

## 2020-03-06 DIAGNOSIS — E039 Hypothyroidism, unspecified: Secondary | ICD-10-CM | POA: Diagnosis not present

## 2020-03-06 DIAGNOSIS — R7303 Prediabetes: Secondary | ICD-10-CM | POA: Diagnosis not present

## 2020-03-06 DIAGNOSIS — I10 Essential (primary) hypertension: Secondary | ICD-10-CM | POA: Diagnosis not present

## 2020-03-13 DIAGNOSIS — Z1231 Encounter for screening mammogram for malignant neoplasm of breast: Secondary | ICD-10-CM | POA: Diagnosis not present

## 2020-03-13 DIAGNOSIS — Z0000 Encounter for general adult medical examination without abnormal findings: Secondary | ICD-10-CM | POA: Diagnosis not present

## 2020-03-13 DIAGNOSIS — E039 Hypothyroidism, unspecified: Secondary | ICD-10-CM | POA: Diagnosis not present

## 2020-03-13 DIAGNOSIS — K219 Gastro-esophageal reflux disease without esophagitis: Secondary | ICD-10-CM | POA: Diagnosis not present

## 2020-03-13 DIAGNOSIS — G3184 Mild cognitive impairment, so stated: Secondary | ICD-10-CM | POA: Diagnosis not present

## 2020-03-13 DIAGNOSIS — R7303 Prediabetes: Secondary | ICD-10-CM | POA: Diagnosis not present

## 2020-03-13 DIAGNOSIS — I10 Essential (primary) hypertension: Secondary | ICD-10-CM | POA: Diagnosis not present

## 2020-04-11 DIAGNOSIS — M722 Plantar fascial fibromatosis: Secondary | ICD-10-CM | POA: Diagnosis not present

## 2020-04-11 DIAGNOSIS — M7751 Other enthesopathy of right foot: Secondary | ICD-10-CM | POA: Diagnosis not present

## 2020-04-11 DIAGNOSIS — M2041 Other hammer toe(s) (acquired), right foot: Secondary | ICD-10-CM | POA: Diagnosis not present

## 2020-04-11 DIAGNOSIS — M79671 Pain in right foot: Secondary | ICD-10-CM | POA: Diagnosis not present

## 2020-05-09 DIAGNOSIS — M7672 Peroneal tendinitis, left leg: Secondary | ICD-10-CM | POA: Diagnosis not present

## 2020-05-09 DIAGNOSIS — M7751 Other enthesopathy of right foot: Secondary | ICD-10-CM | POA: Diagnosis not present

## 2020-05-09 DIAGNOSIS — M722 Plantar fascial fibromatosis: Secondary | ICD-10-CM | POA: Diagnosis not present

## 2020-05-11 ENCOUNTER — Other Ambulatory Visit: Payer: Self-pay | Admitting: Physician Assistant

## 2020-05-11 DIAGNOSIS — Z1231 Encounter for screening mammogram for malignant neoplasm of breast: Secondary | ICD-10-CM

## 2020-05-21 ENCOUNTER — Other Ambulatory Visit: Payer: Self-pay

## 2020-05-21 ENCOUNTER — Emergency Department
Admission: EM | Admit: 2020-05-21 | Discharge: 2020-05-22 | Disposition: A | Discharge status: Home / Self Care | Payer: PPO | Attending: Emergency Medicine | Admitting: Emergency Medicine

## 2020-05-21 ENCOUNTER — Encounter: Payer: Self-pay | Admitting: Emergency Medicine

## 2020-05-21 DIAGNOSIS — K8020 Calculus of gallbladder without cholecystitis without obstruction: Secondary | ICD-10-CM | POA: Diagnosis not present

## 2020-05-21 DIAGNOSIS — Z7982 Long term (current) use of aspirin: Secondary | ICD-10-CM | POA: Diagnosis not present

## 2020-05-21 DIAGNOSIS — I10 Essential (primary) hypertension: Secondary | ICD-10-CM | POA: Diagnosis not present

## 2020-05-21 DIAGNOSIS — K760 Fatty (change of) liver, not elsewhere classified: Secondary | ICD-10-CM | POA: Diagnosis not present

## 2020-05-21 DIAGNOSIS — N281 Cyst of kidney, acquired: Secondary | ICD-10-CM | POA: Diagnosis not present

## 2020-05-21 DIAGNOSIS — Z79899 Other long term (current) drug therapy: Secondary | ICD-10-CM | POA: Insufficient documentation

## 2020-05-21 DIAGNOSIS — R7303 Prediabetes: Secondary | ICD-10-CM | POA: Diagnosis not present

## 2020-05-21 DIAGNOSIS — R1110 Vomiting, unspecified: Secondary | ICD-10-CM | POA: Diagnosis present

## 2020-05-21 DIAGNOSIS — E039 Hypothyroidism, unspecified: Secondary | ICD-10-CM | POA: Diagnosis not present

## 2020-05-21 DIAGNOSIS — K529 Noninfective gastroenteritis and colitis, unspecified: Secondary | ICD-10-CM | POA: Insufficient documentation

## 2020-05-21 LAB — URINALYSIS, COMPLETE (UACMP) WITH MICROSCOPIC
Bacteria, UA: NONE SEEN
Bilirubin Urine: NEGATIVE
Glucose, UA: NEGATIVE mg/dL
Hgb urine dipstick: NEGATIVE
Ketones, ur: 5 mg/dL — AB
Leukocytes,Ua: NEGATIVE
Nitrite: NEGATIVE
Protein, ur: 30 mg/dL — AB
Specific Gravity, Urine: 1.026 (ref 1.005–1.030)
pH: 5 (ref 5.0–8.0)

## 2020-05-21 LAB — CBC
HCT: 40.2 % (ref 36.0–46.0)
Hemoglobin: 13.2 g/dL (ref 12.0–15.0)
MCH: 30.4 pg (ref 26.0–34.0)
MCHC: 32.8 g/dL (ref 30.0–36.0)
MCV: 92.6 fL (ref 80.0–100.0)
Platelets: 237 10*3/uL (ref 150–400)
RBC: 4.34 MIL/uL (ref 3.87–5.11)
RDW: 12.2 % (ref 11.5–15.5)
WBC: 7.1 10*3/uL (ref 4.0–10.5)
nRBC: 0 % (ref 0.0–0.2)

## 2020-05-21 LAB — COMPREHENSIVE METABOLIC PANEL
ALT: 28 U/L (ref 0–44)
AST: 24 U/L (ref 15–41)
Albumin: 4.1 g/dL (ref 3.5–5.0)
Alkaline Phosphatase: 68 U/L (ref 38–126)
Anion gap: 9 (ref 5–15)
BUN: 14 mg/dL (ref 8–23)
CO2: 24 mmol/L (ref 22–32)
Calcium: 8.9 mg/dL (ref 8.9–10.3)
Chloride: 102 mmol/L (ref 98–111)
Creatinine, Ser: 1.01 mg/dL — ABNORMAL HIGH (ref 0.44–1.00)
GFR calc Af Amer: >60 mL/min (ref >60)
GFR calc non Af Amer: 52 mL/min — ABNORMAL LOW (ref >60)
Glucose, Bld: 158 mg/dL — ABNORMAL HIGH (ref 70–99)
Potassium: 3.8 mmol/L (ref 3.5–5.1)
Sodium: 135 mmol/L (ref 135–145)
Total Bilirubin: 0.9 mg/dL (ref 0.3–1.2)
Total Protein: 7.6 g/dL (ref 6.5–8.1)

## 2020-05-21 LAB — LIPASE, BLOOD: Lipase: 26 U/L (ref 11–51)

## 2020-05-21 MED ORDER — ACETAMINOPHEN 325 MG PO TABS
650.0000 mg | ORAL_TABLET | Freq: Once | ORAL | Status: AC
  Administered 2020-05-21: 650 mg via ORAL
  Filled 2020-05-21: qty 2

## 2020-05-21 MED ORDER — ONDANSETRON 4 MG PO TBDP
4.0000 mg | ORAL_TABLET | Freq: Once | ORAL | Status: AC
  Administered 2020-05-21: 4 mg via ORAL
  Filled 2020-05-21: qty 1

## 2020-05-22 ENCOUNTER — Emergency Department: Payer: PPO

## 2020-05-22 ENCOUNTER — Encounter: Payer: Self-pay | Admitting: Radiology

## 2020-05-22 DIAGNOSIS — K760 Fatty (change of) liver, not elsewhere classified: Secondary | ICD-10-CM | POA: Diagnosis not present

## 2020-05-22 DIAGNOSIS — N281 Cyst of kidney, acquired: Secondary | ICD-10-CM | POA: Diagnosis not present

## 2020-05-22 DIAGNOSIS — K8020 Calculus of gallbladder without cholecystitis without obstruction: Secondary | ICD-10-CM | POA: Diagnosis not present

## 2020-05-22 LAB — C DIFFICILE QUICK SCREEN W PCR REFLEX
C Diff antigen: NEGATIVE
C Diff interpretation: NOT DETECTED
C Diff toxin: NEGATIVE

## 2020-05-22 MED ORDER — ONDANSETRON HCL 4 MG/2ML IJ SOLN
4.0000 mg | Freq: Once | INTRAMUSCULAR | Status: DC
  Filled 2020-05-22: qty 2

## 2020-05-22 MED ORDER — LACTATED RINGERS IV BOLUS
1000.0000 mL | Freq: Once | INTRAVENOUS | Status: AC
  Administered 2020-05-22: 1000 mL via INTRAVENOUS

## 2020-05-22 MED ORDER — LOPERAMIDE HCL 2 MG PO TABS: 2.0000 mg | ORAL_TABLET | Freq: Four times a day (QID) | ORAL | 0 refills | Status: AC | PRN

## 2020-05-22 MED ORDER — ONDANSETRON 4 MG PO TBDP: 4.0000 mg | ORAL_TABLET | Freq: Three times a day (TID) | ORAL | 0 refills | Status: AC | PRN

## 2020-05-22 MED ORDER — IOHEXOL 300 MG/ML  SOLN
75.0000 mL | Freq: Once | INTRAMUSCULAR | Status: AC | PRN
  Administered 2020-05-22: 75 mL via INTRAVENOUS

## 2020-05-22 MED ORDER — LOPERAMIDE HCL 2 MG PO CAPS
2.0000 mg | ORAL_CAPSULE | Freq: Once | ORAL | Status: AC
  Administered 2020-05-22: 2 mg via ORAL
  Filled 2020-05-22: qty 1

## 2020-05-22 NOTE — ED Notes (Signed)
PT tolerating fluids well at this point in time

## 2020-05-22 NOTE — ED Notes (Signed)
Hat placed in toilet for pt in case of BM Call light at bedside and pt informed to call if need to use bathroom

## 2020-05-22 NOTE — ED Provider Notes (Signed)
[facility] Emergency Department Provider Note  ____________________________________________  Time seen: Approximately 1:13 AM  I have reviewed the triage vital signs and the nursing notes.   HISTORY  Chief Complaint Emesis and Diarrhea   HPI Jennifer Larsen is a 84 y.o. female with a history of hypertension, hiatal hernia, hypothyroidism, prediabetes  who presents for evaluation of abdominal pain, vomiting and diarrhea.  Patient reports symptoms have been constant for 2 days.  She reports numerous daily episodes of watery diarrhea and 3-4 daily episodes of nonbloody nonbilious emesis.  No melena, no hematochezia, no hematemesis or coffee-ground emesis.  She is also complained of diffuse crampy abdominal pain which has now resolved after patient received Tylenol in the waiting room.  She is not sure if she had a fever at home since she did not check it.  No chest pain or shortness of breath, no cough or congestion, no sore throat, no dysuria or hematuria.  Patient has had a prior hysterectomy but no other abdominal surgeries.  No recent antibiotic use.  No prior history of C. difficile.  Past Medical History:  Diagnosis Date  . Arthritis    fingers  . Dysrhythmia 03/2017   left bundle branch block  . GERD (gastroesophageal reflux disease)   . Hard of hearing   . Hiatal hernia   . Hypertension   . Hypothyroidism   . LBBB (left bundle branch block)   . Myocardial infarction ([place])    ekg shows left bundle branch block per dr. Ubaldo Glassing, NO heart attack  . Pre-diabetes   . Thyroid disease   . Wears dentures    full upper, implants on bottom    Patient Active Problem List   Diagnosis Date Noted  . Distal radius fracture 03/17/2017    Past Surgical History:  Procedure Laterality Date  . ABDOMINAL HYSTERECTOMY    . BREAST CYST ASPIRATION Left 03/24/2014  . BUNIONECTOMY    . CATARACT EXTRACTION W/PHACO Left 09/16/2017   Procedure: CATARACT EXTRACTION  PHACO AND INTRAOCULAR LENS PLACEMENT ([place]) LEFT COMPLICATED;  Surgeon: Leandrew Koyanagi, MD;  Location: [place];  Service: Ophthalmology;  Laterality: Left;  . DENTAL SURGERY     implants on lower teeth  . ESOPHAGOGASTRODUODENOSCOPY (EGD) WITH PROPOFOL N/A 04/05/2018   Procedure: ESOPHAGOGASTRODUODENOSCOPY (EGD) WITH PROPOFOL;  Surgeon: Manya Silvas, MD;  Location[facility] ENDOSCOPY;  Service: Endoscopy;  Laterality: N/A;  . OPEN REDUCTION INTERNAL FIXATION (ORIF) DISTAL RADIAL FRACTURE Left 03/17/2017   Procedure: OPEN REDUCTION INTERNAL FIXATION (ORIF) DISTAL RADIAL FRACTURE;  Surgeon: Hessie Knows, MD;  Location: [facility] ORS;  Service: Orthopedics;  Laterality: Left;    Prior to Admission medications   Medication Sig Start Date End Date Taking? Authorizing Provider  amLODipine (NORVASC) 2.5 MG tablet Take 1 tablet by mouth daily. 10/24/16   [provider]  aspirin 81 MG chewable tablet Chew 40.5 mg by mouth daily. Takes 1/2 tab    [provider]  Biotin 10000 MCG TBDP Take 5,000 mcg by mouth daily.    [provider]  Calcium Carb-Cholecalciferol (CALCIUM CARBONATE-VITAMIN D3 PO) Take by mouth.    [provider]  Cholecalciferol 1000 units tablet Take 1,000 Units by mouth daily.    [provider]  Coenzyme Q10 (COQ10 PO) Take 1 tablet by mouth daily.    [provider]  Glucosamine HCl 1000 MG TABS Take 1,000 mg by mouth daily.    [provider]  lansoprazole (PREVACID) 30 MG capsule  Take 1 capsule by mouth daily. 09/29/16   [provider]  loperamide (IMODIUM A-D) 2 MG tablet Take 1 tablet (2 mg total) by mouth 4 (four) times daily as needed for diarrhea or loose stools. 05/22/20   Rudene Re, MD  Magnesium 250 MG TABS Take 250 mg by mouth daily.    [provider]  Multiple Vitamin (MULTI-VITAMINS) TABS Take 1 tablet by mouth daily.    [provider]  ondansetron (ZOFRAN  ODT) 4 MG disintegrating tablet Take 1 tablet (4 mg total) by mouth every 8 (eight) hours as needed. 05/22/20   Rudene Re, MD  oxyCODONE (OXY IR/ROXICODONE) 5 MG immediate release tablet Take 1-2 tablets (5-10 mg total) by mouth every 3 (three) hours as needed for breakthrough pain. 03/18/17   Duanne Guess, PA-C  SYNTHROID 100 MCG tablet Take 1 tablet by mouth daily. 10/24/16   [provider]  thiamine (VITAMIN B-1) 100 MG tablet Take 100 mg by mouth daily.    [provider]  vitamin B-12 (CYANOCOBALAMIN) 250 MCG tablet Take 250 mcg by mouth daily.    [provider]    Allergies Cephalexin, Clarithromycin, Macrolides and ketolides, and Ofloxacin  Family History  Problem Relation Age of Onset  . Breast cancer Mother 17  . Breast cancer Maternal Aunt 4    Social History Social History   Tobacco Use  . Smoking status: Never Smoker  . Smokeless tobacco: Never Used  Vaping Use  . Vaping Use: Never used  Substance Use Topics  . Alcohol use: No    Comment: occasional social drink, about once a year  . Drug use: No    Review of Systems  Constitutional: Negative for fever. Eyes: Negative for visual changes. ENT: Negative for sore throat. Neck: No neck pain  Cardiovascular: Negative for chest pain. Respiratory: Negative for shortness of breath. Gastrointestinal: + abdominal pain, vomiting and diarrhea. Genitourinary: Negative for dysuria. Musculoskeletal: Negative for back pain. Skin: Negative for rash. Neurological: Negative for headaches, weakness or numbness. Psych: No SI or HI  ____________________________________________   PHYSICAL EXAM:  VITAL SIGNS: ED Triage Vitals [05/21/20 2032]  Enc Vitals Group     BP 127/66     Pulse Rate 92     Resp 18     Temp (!) 100.4 F (38 C)     Temp Source Oral     SpO2 95 %     Weight      Height      Head Circumference      Peak Flow      Pain Score      Pain Loc      Pain Edu?        Excl. in [place]?     Constitutional: Alert and oriented. Well appearing and in no apparent distress. HEENT:      Head: Normocephalic and atraumatic.         Eyes: Conjunctivae are normal. Sclera is non-icteric.       Mouth/Throat: Mucous membranes are moist.       Neck: Supple with no signs of meningismus. Cardiovascular: Regular rate and rhythm. No murmurs, gallops, or rubs.  Respiratory: Normal respiratory effort. Lungs are clear to auscultation bilaterally. No wheezes, crackles, or rhonchi.  Gastrointestinal: Soft, non tender, and non distended with positive bowel sounds. No rebound or guarding. Genitourinary: No CVA tenderness. Musculoskeletal: No edema, cyanosis, or erythema of extremities. Neurologic: Normal speech and language. Face is symmetric. Moving all extremities.  No gross focal neurologic deficits are appreciated. Skin: Skin is warm, dry and intact. No rash noted. Psychiatric: Mood and affect are normal. Speech and behavior are normal.  ____________________________________________   LABS (all labs ordered are listed, but only abnormal results are displayed)  Labs Reviewed  COMPREHENSIVE METABOLIC PANEL - Abnormal; Notable for the following components:      Result Value   Glucose, Bld 158 (*)    Creatinine, Ser 1.01 (*)    GFR calc non Af Amer 52 (*)    All other components within normal limits  URINALYSIS, COMPLETE (UACMP) WITH MICROSCOPIC - Abnormal; Notable for the following components:   Color, Urine AMBER (*)    APPearance CLOUDY (*)    Ketones, ur 5 (*)    Protein, ur 30 (*)    All other components within normal limits  C DIFFICILE QUICK SCREEN W PCR REFLEX  LIPASE, BLOOD  CBC   ____________________________________________  EKG  none  ____________________________________________  RADIOLOGY  I have personally reviewed the images performed during this visit and I agree with the Radiologist's read.   Interpretation by Radiologist:  CT ABDOMEN  PELVIS W CONTRAST  Result Date: 05/22/2020 CLINICAL DATA:  Fever, vomiting and diarrhea. EXAM: CT ABDOMEN AND PELVIS WITH CONTRAST TECHNIQUE: Multidetector CT imaging of the abdomen and pelvis was performed using the standard protocol following bolus administration of intravenous contrast. CONTRAST:  74mL OMNIPAQUE IOHEXOL 300 MG/ML  SOLN COMPARISON:  None. FINDINGS: Lower chest: No acute abnormality. Hepatobiliary: There is diffuse fatty infiltration of the liver parenchyma. No focal liver abnormality is seen. Adjacent 1.2 cm gallstones are seen within the gallbladder lumen without evidence of gallbladder wall thickening or biliary dilatation. Pancreas: Unremarkable. No pancreatic ductal dilatation or surrounding inflammatory changes. Spleen: Normal in size without focal abnormality. Adrenals/Urinary Tract: Adrenal glands are unremarkable. Kidneys are normal in size, without renal calculi or hydronephrosis. A 1.7 cm and 2.2 cm simple cysts are seen within the posterior aspect of the mid to lower left kidney. A 1.8 cm simple cyst is seen within the mid to lower right kidney. Bladder is unremarkable. Stomach/Bowel: There is a small hiatal hernia. Appendix appears normal. No evidence of bowel wall thickening, distention, or inflammatory changes. Vascular/Lymphatic: There is mild calcification of the abdominal aorta. No enlarged abdominal or pelvic lymph nodes. Reproductive: Status post hysterectomy. No adnexal masses. Other: No abdominal wall hernia or abnormality. No abdominopelvic ascites. Musculoskeletal: Moderate severity degenerative changes seen at the level of L2-L3. IMPRESSION: 1. Cholelithiasis without evidence of acute cholecystitis. 2. Hepatic steatosis. 3. Small hiatal hernia. 4. Bilateral simple renal cysts. 5. Moderate severity degenerative changes at the level of L2-L3. 6. Aortic atherosclerosis. Aortic Atherosclerosis (ICD10-I70.0). Electronically Signed   By: Virgina Norfolk M.D.   On: 05/22/2020  01:29      ____________________________________________   PROCEDURES  Procedure(s) performed:yes .1-3 Lead EKG Interpretation Performed by: Rudene Re, MD Authorized by: Rudene Re, MD     Interpretation: normal     ECG rate assessment: normal     Rhythm: sinus rhythm     Critical Care performed:  None ____________________________________________   INITIAL IMPRESSION / ASSESSMENT AND PLAN / ED COURSE   84 y.o. female with a history of hypertension, hiatal hernia, hypothyroidism, prediabetes  who presents for evaluation of abdominal pain, vomiting and diarrhea x 2 days.  Patient is extremely well-appearing in no distress with normal vital signs although she does have a low-grade fever of 100.18F.  Her abdomen is soft with  no tenderness on palpation.  Differential diagnosis including viral gastroenteritis versus colitis versus diverticulitis versus appendicitis versus C. Difficile.  Labs show no leukocytosis, no anemia, normal LFTs, normal lipase, slightly elevated creatinine of 1.01 with a GFR of 52.  UA negative for UTI but does show 5 ketones with 30 protein most likely from dehydration.  Will give IV fluids.  CT abdomen pelvis is pending.  Old medical records reviewed.  _________________________ 4:41 AM on 05/22/2020 -----------------------------------------  CT with no acute findings, confirmed by radiology..  C. difficile negative.  Patient is tolerating p.o.  Will discharge home on Zofran, Imodium, bland diet, and increase oral hydration.  Diagnosis most likely viral gastroenteritis.  Discussed return precautions and close follow-up with PCP.      _____________________________________________ Please note:  Patient was evaluated in Emergency Department today for the symptoms described in the history of present illness. Patient was evaluated in the context of the global COVID-19 pandemic, which necessitated consideration that the patient might be at risk for  infection with the SARS-CoV-2 virus that causes COVID-19. Institutional protocols and algorithms that pertain to the evaluation of patients at risk for COVID-19 are in a state of rapid change based on information released by regulatory bodies including the CDC and federal and state organizations. These policies and algorithms were followed during the patient's care in the ED.  Some ED evaluations and interventions may be delayed as a result of limited staffing during the pandemic.   [place] Controlled Substance Database was reviewed by me. ____________________________________________   FINAL CLINICAL IMPRESSION(S) / ED DIAGNOSES   Final diagnoses:  Gastroenteritis      NEW MEDICATIONS STARTED DURING THIS VISIT:  ED Discharge Orders         Ordered    ondansetron (ZOFRAN ODT) 4 MG disintegrating tablet  Every 8 hours PRN     Discontinue  Reprint     05/22/20 0440    loperamide (IMODIUM A-D) 2 MG tablet  4 times daily PRN     Discontinue  Reprint     05/22/20 0440           Note:  This document was prepared using Dragon voice recognition software and may include unintentional dictation errors.    Alfred Levins, [place], MD 05/22/20 [phone]

## 2020-05-22 NOTE — ED Notes (Signed)
Pt ambulatory to restroom. PT had approx 350mL urine and very very small amount of stool. Gathered what I could to send to lab. PT given diet gingerale for PO challenge per MD

## 2020-05-22 NOTE — Discharge Instructions (Signed)
Take Zofran as needed for nausea and vomiting.  Take Imodium as needed for diarrhea.  Follow-up with your doctor in 2 days.  Eat a bland diet and drink lots of fluids and to your symptoms resolved.  Return to the emergency room for concerns for dehydration, black or bloody stools or vomit, new or worsening abdominal pain, or fever.

## 2020-05-22 NOTE — ED Notes (Signed)
Pt to restroom with Ezzard Flax, RN

## 2020-06-08 ENCOUNTER — Other Ambulatory Visit: Payer: Self-pay

## 2020-06-08 ENCOUNTER — Ambulatory Visit
Admission: RE | Admit: 2020-06-08 | Discharge: 2020-06-08 | Disposition: A | Discharge status: Home / Self Care | Payer: PPO | Source: Ambulatory Visit | Attending: Physician Assistant | Admitting: Physician Assistant

## 2020-06-08 DIAGNOSIS — Z1231 Encounter for screening mammogram for malignant neoplasm of breast: Secondary | ICD-10-CM | POA: Diagnosis not present

## 2020-08-15 DIAGNOSIS — R413 Other amnesia: Secondary | ICD-10-CM | POA: Diagnosis not present

## 2020-08-15 DIAGNOSIS — K219 Gastro-esophageal reflux disease without esophagitis: Secondary | ICD-10-CM | POA: Diagnosis not present

## 2020-10-15 DIAGNOSIS — R7303 Prediabetes: Secondary | ICD-10-CM | POA: Diagnosis not present

## 2020-10-15 DIAGNOSIS — D649 Anemia, unspecified: Secondary | ICD-10-CM | POA: Diagnosis not present

## 2020-10-15 DIAGNOSIS — I10 Essential (primary) hypertension: Secondary | ICD-10-CM | POA: Diagnosis not present

## 2020-10-15 DIAGNOSIS — E039 Hypothyroidism, unspecified: Secondary | ICD-10-CM | POA: Diagnosis not present

## 2020-10-15 DIAGNOSIS — K219 Gastro-esophageal reflux disease without esophagitis: Secondary | ICD-10-CM | POA: Diagnosis not present

## 2020-10-15 DIAGNOSIS — G3184 Mild cognitive impairment, so stated: Secondary | ICD-10-CM | POA: Diagnosis not present

## 2020-10-15 DIAGNOSIS — Z0000 Encounter for general adult medical examination without abnormal findings: Secondary | ICD-10-CM | POA: Diagnosis not present

## 2020-12-21 DIAGNOSIS — E039 Hypothyroidism, unspecified: Secondary | ICD-10-CM | POA: Diagnosis not present

## 2021-03-20 ENCOUNTER — Other Ambulatory Visit: Payer: Self-pay

## 2021-03-20 ENCOUNTER — Ambulatory Visit: Payer: PPO | Admitting: Dermatology

## 2021-03-20 DIAGNOSIS — D1800 Hemangioma unspecified site: Secondary | ICD-10-CM | POA: Diagnosis not present

## 2021-03-20 DIAGNOSIS — D229 Melanocytic nevi, unspecified: Secondary | ICD-10-CM | POA: Diagnosis not present

## 2021-03-20 DIAGNOSIS — L814 Other melanin hyperpigmentation: Secondary | ICD-10-CM | POA: Diagnosis not present

## 2021-03-20 DIAGNOSIS — L821 Other seborrheic keratosis: Secondary | ICD-10-CM | POA: Diagnosis not present

## 2021-03-20 DIAGNOSIS — Z1283 Encounter for screening for malignant neoplasm of skin: Secondary | ICD-10-CM | POA: Diagnosis not present

## 2021-03-20 DIAGNOSIS — L739 Follicular disorder, unspecified: Secondary | ICD-10-CM | POA: Diagnosis not present

## 2021-03-20 DIAGNOSIS — L578 Other skin changes due to chronic exposure to nonionizing radiation: Secondary | ICD-10-CM

## 2021-03-20 MED ORDER — MUPIROCIN 2 % EX OINT: TOPICAL_OINTMENT | CUTANEOUS | 0 refills | Status: AC

## 2021-03-20 MED ORDER — DOXYCYCLINE HYCLATE 100 MG PO TABS: 100.0000 mg | ORAL_TABLET | Freq: Two times a day (BID) | ORAL | 0 refills | Status: AC

## 2021-03-20 NOTE — Patient Instructions (Signed)

## 2021-03-20 NOTE — Progress Notes (Signed)
   Follow-Up Visit   Subjective  Jennifer Larsen is a 85 y.o. female who presents for the following: Annual Exam (Mole check ).Pt c/o red bumps on her shoulders will not go away, treating with Betamethasone and Clotrimazole cream with a poor response. The patient presents for Upper Body Skin Exam (UBSE) for skin cancer screening and mole check.  The following portions of the chart were reviewed this encounter and updated as appropriate:   Tobacco  Allergies  Meds  Problems  Med Hx  Surg Hx  Fam Hx     Review of Systems:  No other skin or systemic complaints except as noted in HPI or Assessment and Plan.  Objective  Well appearing patient in no apparent distress; mood and affect are within normal limits.  All skin waist up examined.  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  Wash hands after touching this area   Start Doxycyline 100 mg take 1 tablet po bid with food #20   Doxycycline should be taken with food to prevent nausea. Do not lay down for 30 minutes after taking. Be cautious with sun exposure and use good sun protection while on this medication. Pregnant women should not take this medication.    Start Mupirocin ointment apply to affected areas twice a day   Ordered Medications: doxycycline (VIBRA-TABS) 100 MG tablet mupirocin ointment (BACTROBAN) 2 %   Lentigines - Scattered tan macules - Due to sun exposure - Benign-appering, observe - Recommend daily broad spectrum sunscreen SPF 30+ to sun-exposed areas, reapply every 2 hours as needed. - Call for any changes  Seborrheic Keratoses - Stuck-on, waxy, tan-brown papules and/or plaques  - Benign-appearing - Discussed benign etiology and prognosis. - Observe - Call for any changes  Melanocytic Nevi - Tan-brown and/or  pink-flesh-colored symmetric macules and papules - Benign appearing on exam today - Observation - Call clinic for new or changing moles - Recommend daily use of broad spectrum spf 30+ sunscreen to sun-exposed areas.   Hemangiomas - Red papules - Discussed benign nature - Observe - Call for any changes  Actinic Damage - Chronic condition, secondary to cumulative UV/sun exposure - diffuse scaly erythematous macules with underlying dyspigmentation - Recommend daily broad spectrum sunscreen SPF 30+ to sun-exposed areas, reapply every 2 hours as needed.  - Staying in the shade or wearing long sleeves, sun glasses (UVA+UVB protection) and wide brim hats (4-inch brim around the entire circumference of the hat) are also recommended for sun protection.  - Call for new or changing lesions.  Skin cancer screening performed today.  Return in about 3 months (around 1/49/7026) for folliculitis .  IMarye Round, CMA, am acting as scribe for Sarina Ser, MD .  Documentation: I have reviewed the above documentation for accuracy and completeness, and I agree with the above.  Sarina Ser, MD

## 2021-03-29 ENCOUNTER — Encounter: Payer: Self-pay | Admitting: Dermatology

## 2021-04-03 DIAGNOSIS — K219 Gastro-esophageal reflux disease without esophagitis: Secondary | ICD-10-CM | POA: Diagnosis not present

## 2021-04-03 DIAGNOSIS — I10 Essential (primary) hypertension: Secondary | ICD-10-CM | POA: Diagnosis not present

## 2021-04-03 DIAGNOSIS — E039 Hypothyroidism, unspecified: Secondary | ICD-10-CM | POA: Diagnosis not present

## 2021-04-03 DIAGNOSIS — Z0000 Encounter for general adult medical examination without abnormal findings: Secondary | ICD-10-CM | POA: Diagnosis not present

## 2021-04-03 DIAGNOSIS — G3184 Mild cognitive impairment, so stated: Secondary | ICD-10-CM | POA: Diagnosis not present

## 2021-04-03 DIAGNOSIS — R7303 Prediabetes: Secondary | ICD-10-CM | POA: Diagnosis not present

## 2021-04-03 DIAGNOSIS — R0989 Other specified symptoms and signs involving the circulatory and respiratory systems: Secondary | ICD-10-CM | POA: Diagnosis not present

## 2021-05-02 ENCOUNTER — Other Ambulatory Visit: Payer: Self-pay | Admitting: Physician Assistant

## 2021-05-02 DIAGNOSIS — Z1231 Encounter for screening mammogram for malignant neoplasm of breast: Secondary | ICD-10-CM

## 2021-05-12 ENCOUNTER — Other Ambulatory Visit: Payer: Self-pay

## 2021-05-12 ENCOUNTER — Emergency Department
Admission: EM | Admit: 2021-05-12 | Discharge: 2021-05-12 | Disposition: A | Discharge status: Home / Self Care | Payer: PPO | Attending: Emergency Medicine | Admitting: Emergency Medicine

## 2021-05-12 ENCOUNTER — Encounter: Payer: Self-pay | Admitting: Emergency Medicine

## 2021-05-12 DIAGNOSIS — Z79899 Other long term (current) drug therapy: Secondary | ICD-10-CM | POA: Insufficient documentation

## 2021-05-12 DIAGNOSIS — Z7982 Long term (current) use of aspirin: Secondary | ICD-10-CM | POA: Insufficient documentation

## 2021-05-12 DIAGNOSIS — I10 Essential (primary) hypertension: Secondary | ICD-10-CM | POA: Insufficient documentation

## 2021-05-12 DIAGNOSIS — R300 Dysuria: Secondary | ICD-10-CM | POA: Insufficient documentation

## 2021-05-12 LAB — URINALYSIS, COMPLETE (UACMP) WITH MICROSCOPIC
Bacteria, UA: NONE SEEN
Bilirubin Urine: NEGATIVE
Glucose, UA: NEGATIVE mg/dL
Ketones, ur: NEGATIVE mg/dL
Leukocytes,Ua: NEGATIVE
Nitrite: NEGATIVE
Protein, ur: NEGATIVE mg/dL
Specific Gravity, Urine: 1.014 (ref 1.005–1.030)
Squamous Epithelial / HPF: NONE SEEN (ref 0–5)
pH: 7 (ref 5.0–8.0)

## 2021-05-12 MED ORDER — FOSFOMYCIN TROMETHAMINE 3 G PO PACK
3.0000 g | PACK | Freq: Once | ORAL | Status: AC
  Administered 2021-05-12: 3 g via ORAL
  Filled 2021-05-12: qty 3

## 2021-05-12 MED ORDER — PHENAZOPYRIDINE HCL 200 MG PO TABS: 200.0000 mg | ORAL_TABLET | Freq: Three times a day (TID) | ORAL | 0 refills | Status: AC | PRN

## 2021-05-12 NOTE — ED Notes (Signed)
See triage note Presents with some urinary freq and burning since last pm

## 2021-05-12 NOTE — ED Provider Notes (Signed)
[facility] Emergency Department Provider Note ____________________________________________   Event Date/Time   First MD Initiated Contact with Patient 05/12/21 1119     (approximate)  I have reviewed the triage vital signs and the nursing notes.   HISTORY  Chief Complaint Urinary Tract Infection  HPI Jennifer Larsen is a 85 y.o. female with history of hypertension, MI, and remaining history as listed below presents to the emergency department for treatment and evaluation of dysuria since last night. She denies fever, vomiting, or other concerns.       Past Medical History:  Diagnosis Date   Arthritis    fingers   Dysrhythmia 03/2017   left bundle branch block   GERD (gastroesophageal reflux disease)    Hard of hearing    Hiatal hernia    Hypertension    Hypothyroidism    LBBB (left bundle branch block)    Myocardial infarction ([place])    ekg shows left bundle branch block per dr. Ubaldo Glassing, NO heart attack   Pre-diabetes    Thyroid disease    Wears dentures    full upper, implants on bottom    Patient Active Problem List   Diagnosis Date Noted   Distal radius fracture 03/17/2017    Past Surgical History:  Procedure Laterality Date   ABDOMINAL HYSTERECTOMY     BREAST CYST ASPIRATION Left 03/24/2014   BUNIONECTOMY     CATARACT EXTRACTION W/PHACO Left 09/16/2017   Procedure: CATARACT EXTRACTION PHACO AND INTRAOCULAR LENS PLACEMENT ([place]) LEFT COMPLICATED;  Surgeon: Leandrew Koyanagi, MD;  Location: [place];  Service: Ophthalmology;  Laterality: Left;   DENTAL SURGERY     implants on lower teeth   ESOPHAGOGASTRODUODENOSCOPY (EGD) WITH PROPOFOL N/A 04/05/2018   Procedure: ESOPHAGOGASTRODUODENOSCOPY (EGD) WITH PROPOFOL;  Surgeon: Manya Silvas, MD;  Location: [facility] ENDOSCOPY;  Service: Endoscopy;  Laterality: N/A;   OPEN REDUCTION INTERNAL FIXATION (ORIF) DISTAL RADIAL FRACTURE Left 03/17/2017   Procedure: OPEN REDUCTION  INTERNAL FIXATION (ORIF) DISTAL RADIAL FRACTURE;  Surgeon: Hessie Knows, MD;  Location: [facility] ORS;  Service: Orthopedics;  Laterality: Left;    Prior to Admission medications   Medication Sig Start Date End Date Taking? Authorizing Provider  phenazopyridine (PYRIDIUM) 200 MG tablet Take 1 tablet (200 mg total) by mouth 3 (three) times daily as needed for pain. 05/12/21  Yes Shaquan Puerta B, FNP  amLODipine (NORVASC) 2.5 MG tablet Take 1 tablet by mouth daily. 10/24/16   [provider]  aspirin 81 MG chewable tablet Chew 40.5 mg by mouth daily. Takes 1/2 tab    [provider]  Biotin 10000 MCG TBDP Take 5,000 mcg by mouth daily.    [provider]  Calcium Carb-Cholecalciferol (CALCIUM CARBONATE-VITAMIN D3 PO) Take by mouth.    [provider]  Cholecalciferol 1000 units tablet Take 1,000 Units by mouth daily.    [provider]  Coenzyme Q10 (COQ10 PO) Take 1 tablet by mouth daily.    [provider]  Glucosamine HCl 1000 MG TABS Take 1,000 mg by mouth daily.    [provider]  lansoprazole (PREVACID) 30 MG capsule Take 1 capsule by mouth daily. 09/29/16   [provider]  loperamide (IMODIUM A-D) 2 MG tablet Take 1 tablet (2 mg total) by mouth 4 (four) times daily as needed for diarrhea or loose stools. 05/22/20   Rudene Re, MD  Magnesium 250 MG TABS Take 250 mg by mouth daily.    [provider]  Multiple  Vitamin (MULTI-VITAMINS) TABS Take 1 tablet by mouth daily.    [provider]  mupirocin ointment (BACTROBAN) 2 % Apply to irritated areas on skin qd-bid 03/20/21   Ralene Bathe, MD  ondansetron (ZOFRAN ODT) 4 MG disintegrating tablet Take 1 tablet (4 mg total) by mouth every 8 (eight) hours as needed. 05/22/20   Rudene Re, MD  oxyCODONE (OXY IR/ROXICODONE) 5 MG immediate release tablet Take 1-2 tablets (5-10 mg total) by mouth every 3 (three) hours as needed for breakthrough pain.  03/18/17   Duanne Guess, PA-C  SYNTHROID 100 MCG tablet Take 1 tablet by mouth daily. 10/24/16   [provider]  thiamine (VITAMIN B-1) 100 MG tablet Take 100 mg by mouth daily.    [provider]  vitamin B-12 (CYANOCOBALAMIN) 250 MCG tablet Take 250 mcg by mouth daily.    [provider]    Allergies Cephalexin, Clarithromycin, Macrolides and ketolides, and Ofloxacin  Family History  Problem Relation Age of Onset   Breast cancer Mother 67   Breast cancer Maternal Aunt 35    Social History Social History   Tobacco Use   Smoking status: Never   Smokeless tobacco: Never  Vaping Use   Vaping Use: Never used  Substance Use Topics   Alcohol use: No    Comment: occasional social drink, about once a year   Drug use: No    Review of Systems  Constitutional: No fever/chills Eyes: No visual changes. ENT: No sore throat. Cardiovascular: Denies chest pain. Respiratory: Denies shortness of breath. Gastrointestinal: No abdominal pain.  No nausea, no vomiting.  No diarrhea.  No constipation. Genitourinary: Positive for dysuria. Musculoskeletal: Negative for back pain. Skin: Negative for rash. Neurological: Negative for headaches, focal weakness or numbness. ___________________________________________   PHYSICAL EXAM:  VITAL SIGNS: ED Triage Vitals [05/12/21 1043]  Enc Vitals Group     BP (!) 160/74     Pulse Rate (!) 59     Resp 20     Temp (!) 97.5 F (36.4 C)     Temp Source Oral     SpO2 97 %     Weight 140 lb (63.5 kg)     Height 5' (1.524 m)     Head Circumference      Peak Flow      Pain Score 0     Pain Loc      Pain Edu?      Excl. in [place]?     Constitutional: Alert and oriented. Well appearing and in no acute distress. Eyes: Conjunctivae are normal.  Head: Atraumatic. Nose: No congestion/rhinnorhea. Mouth/Throat: Mucous membranes are moist.  Neck: No stridor.   Hematological/Lymphatic/Immunilogical: No cervical  lymphadenopathy. Cardiovascular: Normal rate, regular rhythm. Grossly normal heart sounds.  Good peripheral circulation. Respiratory: Normal respiratory effort.  No retractions. Lungs CTAB. Gastrointestinal: Soft and nontender. No distention. No abdominal bruits. No CVA tenderness. Genitourinary:  Musculoskeletal: No lower extremity tenderness nor edema.  No joint effusions. Neurologic:  Normal speech and language. No gross focal neurologic deficits are appreciated. No gait instability. Skin:  Skin is warm, dry and intact. No rash noted. Psychiatric: Mood and affect are normal. Speech and behavior are normal.  ____________________________________________   LABS (all labs ordered are listed, but only abnormal results are displayed)  Labs Reviewed  URINALYSIS, COMPLETE (UACMP) WITH MICROSCOPIC - Abnormal; Notable for the following components:      Result Value   Color, Urine YELLOW (*)    APPearance CLEAR (*)  Hgb urine dipstick SMALL (*)    All other components within normal limits  URINE CULTURE   ____________________________________________  EKG  Not indicated. ____________________________________________  RADIOLOGY  ED MD interpretation:    Not indicated  I, Sherrie George, personally viewed and evaluated these images (plain radiographs) as part of my medical decision making, as well as reviewing the written report by the radiologist.  Official radiology report(s): No results found.  ____________________________________________   PROCEDURES  Procedure(s) performed (including Critical Care):  Procedures  ____________________________________________   INITIAL IMPRESSION / ASSESSMENT AND PLAN     85 year old female presenting to the emergency department for treatment and evaluation of dysuria that started last night.  See HPI for further details.  Plan will be to review the urinalysis results  DIFFERENTIAL DIAGNOSIS  Acute cystitis, pyelonephritis,  vaginitis  ED COURSE  Urinalysis is overall reassuring.  She does have a small amount hemoglobin but otherwise the urinalysis does not indicate evidence of dysuria.  Plan will be to send the urine for culture, give her a single dose of fosfomycin based on the symptoms, and prescribe Pyridium for the next couple of days.  She is to follow-up with her primary care provider for repeat urinalysis within about 7 to 10 days.  She was encouraged to return to the emergency department for symptoms of change or worsen if she is unable to schedule appointment.    ___________________________________________   FINAL CLINICAL IMPRESSION(S) / ED DIAGNOSES  Final diagnoses:  Dysuria     ED Discharge Orders          Ordered    phenazopyridine (PYRIDIUM) 200 MG tablet  3 times daily PRN        05/12/21 [place] N[place]ve was evaluated in Emergency Department on 05/12/2021 for the symptoms described in the history of present illness. She was evaluated in the context of the global COVID-19 pandemic, which necessitated consideration that the patient might be at risk for infection with the SARS-CoV-2 virus that causes COVID-19. Institutional protocols and algorithms that pertain to the evaluation of patients at risk for COVID-19 are in a state of rapid change based on information released by regulatory bodies including the CDC and federal and state organizations. These policies and algorithms were followed during the patient's care in the ED.   Note:  This document was prepared using Dragon voice recognition software and may include unintentional dictation errors.    Victorino Dike, FNP 05/12/21 1506    Naaman Plummer, MD 05/13/21 1930

## 2021-05-12 NOTE — ED Triage Notes (Addendum)
Pt via POV from home. Pt c/o burning when she pees since last night. Denies incontinence. Denies fever. Denies pain. Pt is A&Ox4 and NAD. Ambulatory to triage.

## 2021-05-14 LAB — URINE CULTURE: Culture: <10000 — AB

## 2021-05-23 DIAGNOSIS — E039 Hypothyroidism, unspecified: Secondary | ICD-10-CM | POA: Diagnosis not present

## 2021-06-10 ENCOUNTER — Other Ambulatory Visit: Payer: Self-pay

## 2021-06-10 ENCOUNTER — Ambulatory Visit
Admission: RE | Admit: 2021-06-10 | Discharge: 2021-06-10 | Disposition: A | Discharge status: Home / Self Care | Payer: PPO | Source: Ambulatory Visit | Attending: Physician Assistant | Admitting: Physician Assistant

## 2021-06-10 DIAGNOSIS — Z1231 Encounter for screening mammogram for malignant neoplasm of breast: Secondary | ICD-10-CM | POA: Insufficient documentation

## 2021-06-20 ENCOUNTER — Ambulatory Visit: Payer: PPO | Admitting: Dermatology

## 2021-06-26 ENCOUNTER — Ambulatory Visit: Payer: PPO | Admitting: Dermatology

## 2021-07-26 DIAGNOSIS — R070 Pain in throat: Secondary | ICD-10-CM | POA: Diagnosis not present

## 2021-07-26 DIAGNOSIS — K219 Gastro-esophageal reflux disease without esophagitis: Secondary | ICD-10-CM | POA: Diagnosis not present

## 2021-07-26 DIAGNOSIS — H903 Sensorineural hearing loss, bilateral: Secondary | ICD-10-CM | POA: Diagnosis not present

## 2021-10-02 DIAGNOSIS — I10 Essential (primary) hypertension: Secondary | ICD-10-CM | POA: Diagnosis not present

## 2021-10-02 DIAGNOSIS — R7303 Prediabetes: Secondary | ICD-10-CM | POA: Diagnosis not present

## 2021-10-02 DIAGNOSIS — E039 Hypothyroidism, unspecified: Secondary | ICD-10-CM | POA: Diagnosis not present

## 2021-10-07 DIAGNOSIS — R7303 Prediabetes: Secondary | ICD-10-CM | POA: Diagnosis not present

## 2021-10-07 DIAGNOSIS — G3184 Mild cognitive impairment, so stated: Secondary | ICD-10-CM | POA: Diagnosis not present

## 2021-10-07 DIAGNOSIS — R0989 Other specified symptoms and signs involving the circulatory and respiratory systems: Secondary | ICD-10-CM | POA: Diagnosis not present

## 2021-10-07 DIAGNOSIS — I10 Essential (primary) hypertension: Secondary | ICD-10-CM | POA: Diagnosis not present

## 2021-10-07 DIAGNOSIS — E039 Hypothyroidism, unspecified: Secondary | ICD-10-CM | POA: Diagnosis not present

## 2021-10-07 DIAGNOSIS — K219 Gastro-esophageal reflux disease without esophagitis: Secondary | ICD-10-CM | POA: Diagnosis not present

## 2021-12-06 DIAGNOSIS — M8580 Other specified disorders of bone density and structure, unspecified site: Secondary | ICD-10-CM | POA: Diagnosis not present

## 2021-12-06 DIAGNOSIS — H9190 Unspecified hearing loss, unspecified ear: Secondary | ICD-10-CM | POA: Diagnosis not present

## 2021-12-06 DIAGNOSIS — E663 Overweight: Secondary | ICD-10-CM | POA: Diagnosis not present

## 2021-12-06 DIAGNOSIS — F0280 Dementia in other diseases classified elsewhere without behavioral disturbance: Secondary | ICD-10-CM | POA: Diagnosis not present

## 2021-12-06 DIAGNOSIS — K219 Gastro-esophageal reflux disease without esophagitis: Secondary | ICD-10-CM | POA: Diagnosis not present

## 2021-12-06 DIAGNOSIS — E039 Hypothyroidism, unspecified: Secondary | ICD-10-CM | POA: Diagnosis not present

## 2021-12-06 DIAGNOSIS — M1990 Unspecified osteoarthritis, unspecified site: Secondary | ICD-10-CM | POA: Diagnosis not present

## 2021-12-06 DIAGNOSIS — I10 Essential (primary) hypertension: Secondary | ICD-10-CM | POA: Diagnosis not present

## 2021-12-16 DIAGNOSIS — J069 Acute upper respiratory infection, unspecified: Secondary | ICD-10-CM | POA: Diagnosis not present

## 2022-01-30 DIAGNOSIS — H903 Sensorineural hearing loss, bilateral: Secondary | ICD-10-CM | POA: Diagnosis not present

## 2022-01-30 DIAGNOSIS — H6123 Impacted cerumen, bilateral: Secondary | ICD-10-CM | POA: Diagnosis not present

## 2022-04-01 DIAGNOSIS — R7303 Prediabetes: Secondary | ICD-10-CM | POA: Diagnosis not present

## 2022-04-01 DIAGNOSIS — E039 Hypothyroidism, unspecified: Secondary | ICD-10-CM | POA: Diagnosis not present

## 2022-04-01 DIAGNOSIS — I10 Essential (primary) hypertension: Secondary | ICD-10-CM | POA: Diagnosis not present

## 2022-04-07 DIAGNOSIS — I10 Essential (primary) hypertension: Secondary | ICD-10-CM | POA: Diagnosis not present

## 2022-04-07 DIAGNOSIS — E039 Hypothyroidism, unspecified: Secondary | ICD-10-CM | POA: Diagnosis not present

## 2022-04-07 DIAGNOSIS — G3184 Mild cognitive impairment, so stated: Secondary | ICD-10-CM | POA: Diagnosis not present

## 2022-04-07 DIAGNOSIS — Z0000 Encounter for general adult medical examination without abnormal findings: Secondary | ICD-10-CM | POA: Diagnosis not present

## 2022-04-07 DIAGNOSIS — R7303 Prediabetes: Secondary | ICD-10-CM | POA: Diagnosis not present

## 2022-05-12 ENCOUNTER — Other Ambulatory Visit: Payer: Self-pay | Admitting: Physician Assistant

## 2022-05-12 DIAGNOSIS — Z1231 Encounter for screening mammogram for malignant neoplasm of breast: Secondary | ICD-10-CM

## 2022-05-19 DIAGNOSIS — M722 Plantar fascial fibromatosis: Secondary | ICD-10-CM | POA: Diagnosis not present

## 2022-05-26 DIAGNOSIS — E039 Hypothyroidism, unspecified: Secondary | ICD-10-CM | POA: Diagnosis not present

## 2022-05-26 DIAGNOSIS — G3184 Mild cognitive impairment, so stated: Secondary | ICD-10-CM | POA: Diagnosis not present

## 2022-05-26 DIAGNOSIS — I10 Essential (primary) hypertension: Secondary | ICD-10-CM | POA: Diagnosis not present

## 2022-06-11 ENCOUNTER — Ambulatory Visit
Admission: RE | Admit: 2022-06-11 | Discharge: 2022-06-11 | Disposition: A | Discharge status: Home / Self Care | Payer: PPO | Source: Ambulatory Visit | Attending: Physician Assistant | Admitting: Physician Assistant

## 2022-06-11 DIAGNOSIS — R410 Disorientation, unspecified: Secondary | ICD-10-CM | POA: Diagnosis not present

## 2022-06-11 DIAGNOSIS — Z1231 Encounter for screening mammogram for malignant neoplasm of breast: Secondary | ICD-10-CM | POA: Diagnosis not present

## 2022-06-11 DIAGNOSIS — I10 Essential (primary) hypertension: Secondary | ICD-10-CM | POA: Diagnosis not present

## 2022-06-11 DIAGNOSIS — R0902 Hypoxemia: Secondary | ICD-10-CM | POA: Diagnosis not present

## 2022-06-11 DIAGNOSIS — R61 Generalized hyperhidrosis: Secondary | ICD-10-CM | POA: Diagnosis not present

## 2022-06-23 DIAGNOSIS — G3184 Mild cognitive impairment, so stated: Secondary | ICD-10-CM | POA: Diagnosis not present

## 2022-06-23 DIAGNOSIS — I10 Essential (primary) hypertension: Secondary | ICD-10-CM | POA: Diagnosis not present

## 2022-06-23 DIAGNOSIS — I447 Left bundle-branch block, unspecified: Secondary | ICD-10-CM | POA: Diagnosis not present

## 2022-08-18 ENCOUNTER — Ambulatory Visit: Payer: PPO | Admitting: Dermatology

## 2022-08-18 DIAGNOSIS — L720 Epidermal cyst: Secondary | ICD-10-CM | POA: Diagnosis not present

## 2022-08-18 NOTE — Progress Notes (Signed)
   Follow-Up Visit   Subjective  Jennifer Larsen is a 86 y.o. female who presents for the following: check spot (L groin, not sure how long it has been there, may be getting larger, no symptoms).   The following portions of the chart were reviewed this encounter and updated as appropriate:       Review of Systems:  No other skin or systemic complaints except as noted in HPI or Assessment and Plan.  Objective  Well appearing patient in no apparent distress; mood and affect are within normal limits.  A focused examination was performed including groin. Relevant physical exam findings are noted in the Assessment and Plan.  L perineal 3.39m firm white pap L perivulval fold    Assessment & Plan  Epidermal cyst L perineal  Benign, observe Discussed if symptomatic can extract/excise   Return if symptoms worsen or fail to improve.  I, SOthelia Pulling RMA, am acting as scribe for TBrendolyn Patty MD .  Documentation: I have reviewed the above documentation for accuracy and completeness, and I agree with the above.  TBrendolyn PattyMD

## 2022-08-18 NOTE — Patient Instructions (Signed)
Due to recent changes in healthcare laws, you may see results of your pathology and/or laboratory studies on MyChart before the doctors have had a chance to review them. We understand that in some cases there may be results that are confusing or concerning to you. Please understand that not all results are received at the same time and often the doctors may need to interpret multiple results in order to provide you with the best plan of care or course of treatment. Therefore, we ask that you please give us 2 business days to thoroughly review all your results before contacting the office for clarification. Should we see a critical lab result, you will be contacted sooner.   If You Need Anything After Your Visit  If you have any questions or concerns for your doctor, please call our main line at [phone] and press option 4 to reach your doctor's medical assistant. If no one answers, please leave a voicemail as directed and we will return your call as soon as possible. Messages left after 4 pm will be answered the following business day.   You may also send us a message via MyChart. We typically respond to MyChart messages within 1-2 business days.  For prescription refills, please ask your pharmacy to contact our office. Our fax number is [phone].  If you have an urgent issue when the clinic is closed that cannot wait until the next business day, you can page your doctor at the number below.    Please note that while we do our best to be available for urgent issues outside of office hours, we are not available 24/7.   If you have an urgent issue and are unable to reach us, you may choose to seek medical care at your doctor's office, retail clinic, urgent care center, or emergency room.  If you have a medical emergency, please immediately call 911 or go to the emergency department.  Pager Numbers  - Dr. Kowalski: [phone]  - Dr. Moye: [phone]  - Dr. Stewart:  [phone]  In the event of inclement weather, please call our main line at [phone] for an update on the status of any delays or closures.  Dermatology Medication Tips: Please keep the boxes that topical medications come in in order to help keep track of the instructions about where and how to use these. Pharmacies typically print the medication instructions only on the boxes and not directly on the medication tubes.   If your medication is too expensive, please contact our office at [phone] option 4 or send us a message through MyChart.   We are unable to tell what your co-pay for medications will be in advance as this is different depending on your insurance coverage. However, we may be able to find a substitute medication at lower cost or fill out paperwork to get insurance to cover a needed medication.   If a prior authorization is required to get your medication covered by your insurance company, please allow us 1-2 business days to complete this process.  Drug prices often vary depending on where the prescription is filled and some pharmacies may offer cheaper prices.  The website www.goodrx.com contains coupons for medications through different pharmacies. The prices here do not account for what the cost may be with help from insurance (it may be cheaper with your insurance), but the website can give you the price if you did not use any insurance.  - You can print the associated coupon and take it with   your prescription to the pharmacy.  - You may also stop by our office during regular business hours and pick up a GoodRx coupon card.  - If you need your prescription sent electronically to a different pharmacy, notify our office through [facility] MyChart or by phone at [phone] option 4.     Si Usted Necesita Algo Despus de Su Visita  Tambin puede enviarnos un mensaje a travs de MyChart. Por lo general respondemos a los mensajes de MyChart en el transcurso de 1 a 2  das hbiles.  Para renovar recetas, por favor pida a su farmacia que se ponga en contacto con nuestra oficina. Nuestro nmero de fax es el [phone].  Si tiene un asunto urgente cuando la clnica est cerrada y que no puede esperar hasta el siguiente da hbil, puede llamar/localizar a su doctor(a) al nmero que aparece a continuacin.   Por favor, tenga en cuenta que aunque hacemos todo lo posible para estar disponibles para asuntos urgentes fuera del horario de oficina, no estamos disponibles las 24 horas del da, los 7 das de la semana.   Si tiene un problema urgente y no puede comunicarse con nosotros, puede optar por buscar atencin mdica  en el consultorio de su doctor(a), en una clnica privada, en un centro de atencin urgente o en una sala de emergencias.  Si tiene una emergencia mdica, por favor llame inmediatamente al 911 o vaya a la sala de emergencias.  Nmeros de bper  - Dr. Kowalski: [phone]  - Dra. Moye: [phone]  - Dra. Stewart: [phone]  En caso de inclemencias del tiempo, por favor llame a nuestra lnea principal al [phone] para una actualizacin sobre el estado de cualquier retraso o cierre.  Consejos para la medicacin en dermatologa: Por favor, guarde las cajas en las que vienen los medicamentos de uso tpico para ayudarle a seguir las instrucciones sobre dnde y cmo usarlos. Las farmacias generalmente imprimen las instrucciones del medicamento slo en las cajas y no directamente en los tubos del medicamento.   Si su medicamento es muy caro, por favor, pngase en contacto con nuestra oficina llamando al [phone] y presione la opcin 4 o envenos un mensaje a travs de MyChart.   No podemos decirle cul ser su copago por los medicamentos por adelantado ya que esto es diferente dependiendo de la cobertura de su seguro. Sin embargo, es posible que podamos encontrar un medicamento sustituto a menor costo o llenar un formulario para que el  seguro cubra el medicamento que se considera necesario.   Si se requiere una autorizacin previa para que su compaa de seguros cubra su medicamento, por favor permtanos de 1 a 2 das hbiles para completar este proceso.  Los precios de los medicamentos varan con frecuencia dependiendo del lugar de dnde se surte la receta y alguna farmacias pueden ofrecer precios ms baratos.  El sitio web www.goodrx.com tiene cupones para medicamentos de diferentes farmacias. Los precios aqu no tienen en cuenta lo que podra costar con la ayuda del seguro (puede ser ms barato con su seguro), pero el sitio web puede darle el precio si no utiliz ningn seguro.  - Puede imprimir el cupn correspondiente y llevarlo con su receta a la farmacia.  - Tambin puede pasar por nuestra oficina durante el horario de atencin regular y recoger una tarjeta de cupones de GoodRx.  - Si necesita que su receta se enve electrnicamente a una farmacia diferente, informe a nuestra oficina a travs de MyChart de [place]   o por telfono llamando al [phone] y presione la opcin 4.  

## 2022-09-19 DIAGNOSIS — K219 Gastro-esophageal reflux disease without esophagitis: Secondary | ICD-10-CM | POA: Diagnosis not present

## 2022-09-19 DIAGNOSIS — I10 Essential (primary) hypertension: Secondary | ICD-10-CM | POA: Diagnosis not present

## 2022-09-19 DIAGNOSIS — G3184 Mild cognitive impairment, so stated: Secondary | ICD-10-CM | POA: Diagnosis not present

## 2022-09-19 DIAGNOSIS — H9193 Unspecified hearing loss, bilateral: Secondary | ICD-10-CM | POA: Diagnosis not present

## 2022-10-07 DIAGNOSIS — K219 Gastro-esophageal reflux disease without esophagitis: Secondary | ICD-10-CM | POA: Diagnosis not present

## 2022-10-07 DIAGNOSIS — E039 Hypothyroidism, unspecified: Secondary | ICD-10-CM | POA: Diagnosis not present

## 2022-10-07 DIAGNOSIS — I10 Essential (primary) hypertension: Secondary | ICD-10-CM | POA: Diagnosis not present

## 2022-10-07 DIAGNOSIS — G3184 Mild cognitive impairment, so stated: Secondary | ICD-10-CM | POA: Diagnosis not present

## 2022-10-07 DIAGNOSIS — R7303 Prediabetes: Secondary | ICD-10-CM | POA: Diagnosis not present

## 2022-10-07 DIAGNOSIS — M545 Low back pain, unspecified: Secondary | ICD-10-CM | POA: Diagnosis not present

## 2022-11-26 DIAGNOSIS — G8929 Other chronic pain: Secondary | ICD-10-CM | POA: Diagnosis not present

## 2022-11-26 DIAGNOSIS — M545 Low back pain, unspecified: Secondary | ICD-10-CM | POA: Diagnosis not present

## 2022-11-26 DIAGNOSIS — G3184 Mild cognitive impairment, so stated: Secondary | ICD-10-CM | POA: Diagnosis not present

## 2022-12-06 IMAGING — MG MM DIGITAL SCREENING BILAT W/ TOMO AND CAD
8 series · 8 of 24 positions shown · non-contrast
Comparison: Previous exam(s).

CLINICAL DATA: Screening.

EXAM:
DIGITAL SCREENING BILATERAL MAMMOGRAM WITH TOMOSYNTHESIS AND CAD
TECHNIQUE: Bilateral screening digital craniocaudal and mediolateral oblique
mammograms were obtained. Bilateral screening digital breast
tomosynthesis was performed. The images were evaluated with
computer-aided detection.

[L CC synth-2D]
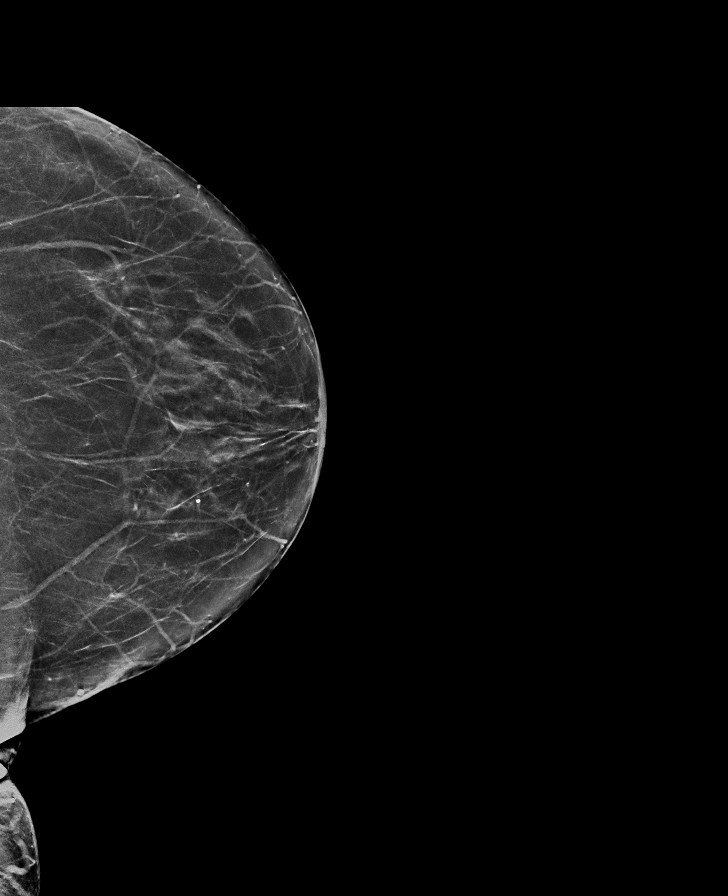

[R MLO synth-2D]
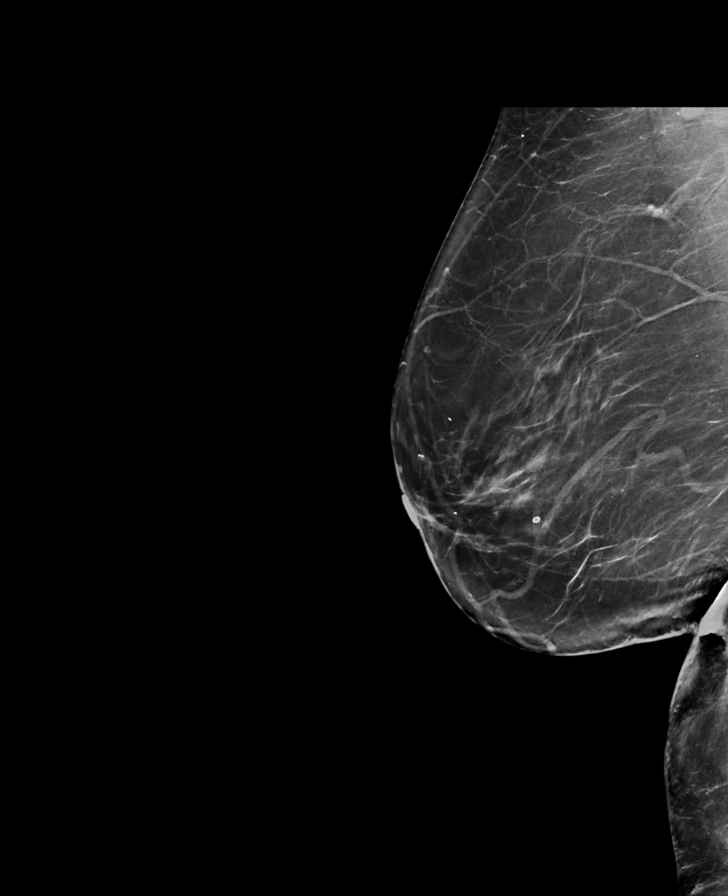

[R CC synth-2D]
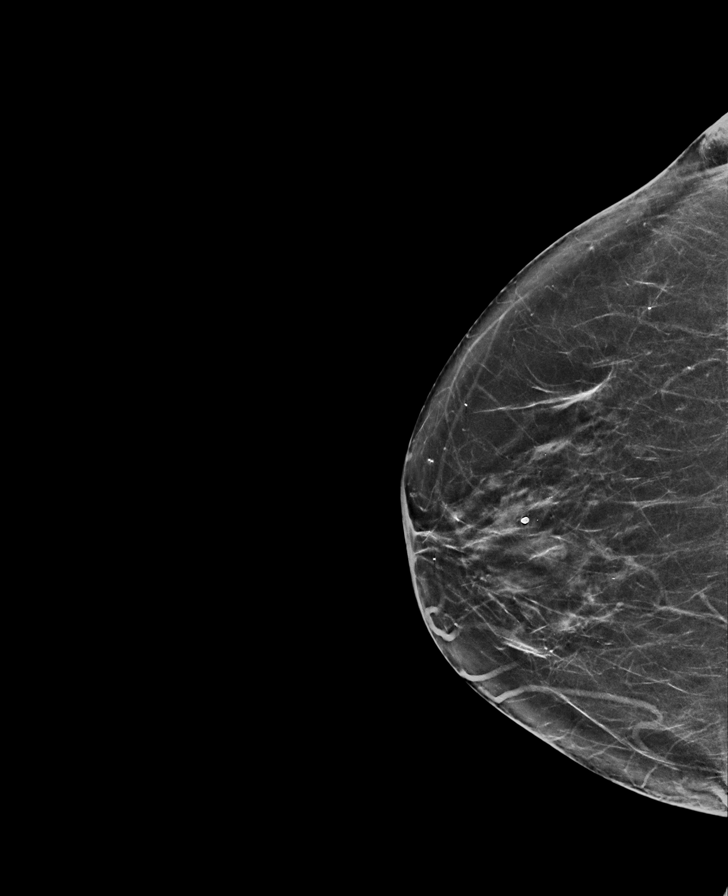

[L MLO synth-2D]
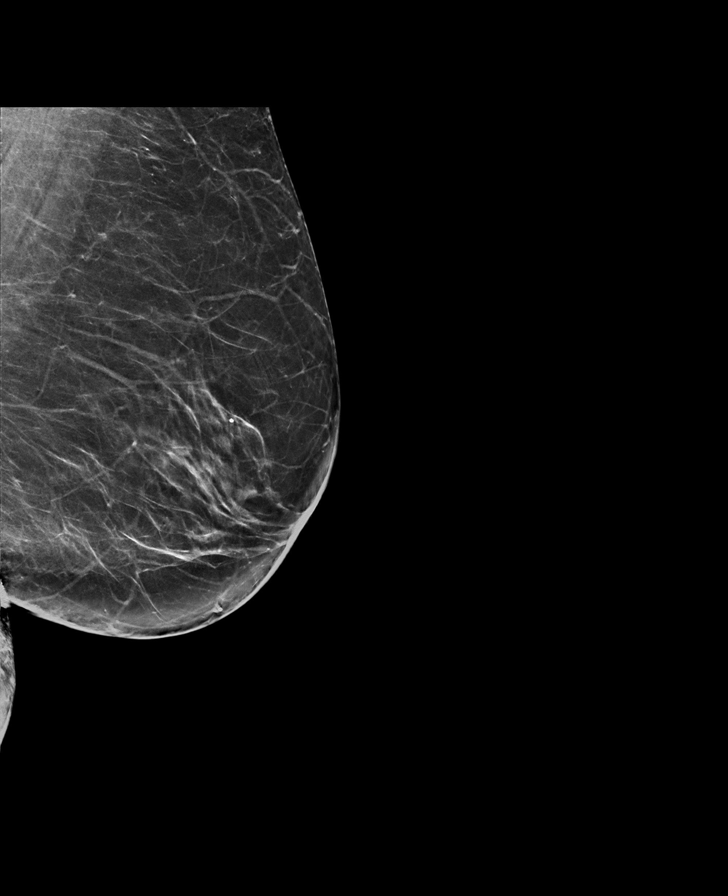

[L CC tomo · tomo slice 33/66.0]
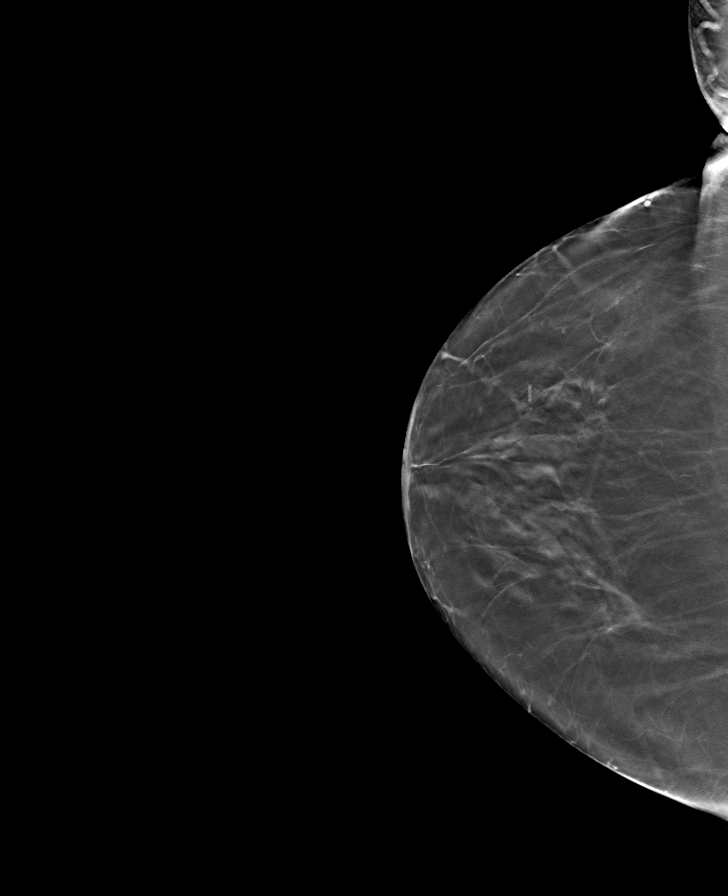

[L MLO tomo · tomo slice 37/73.0]
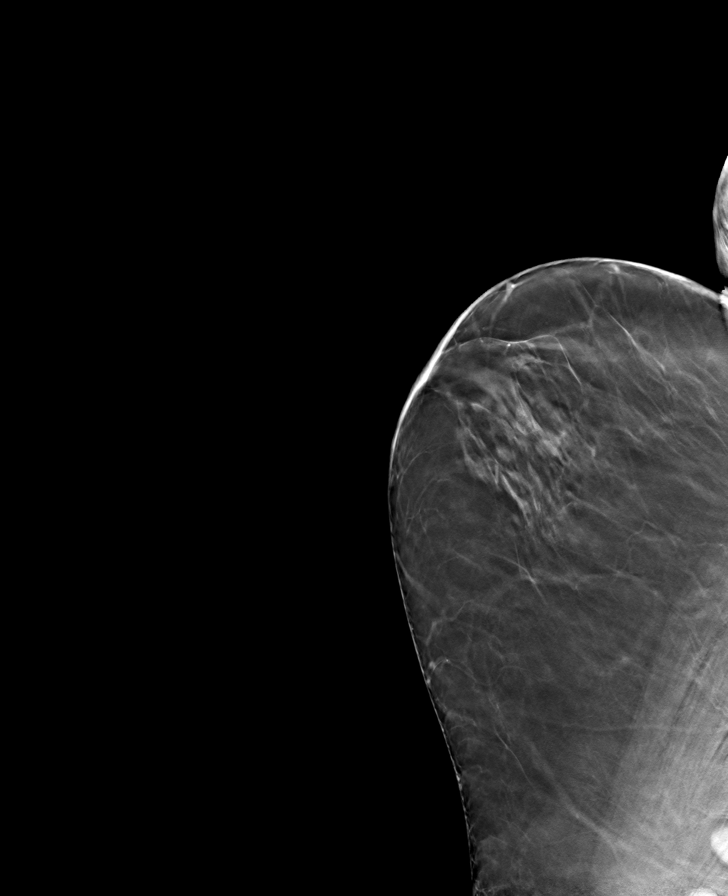

[R MLO tomo · tomo slice 39/77.0]
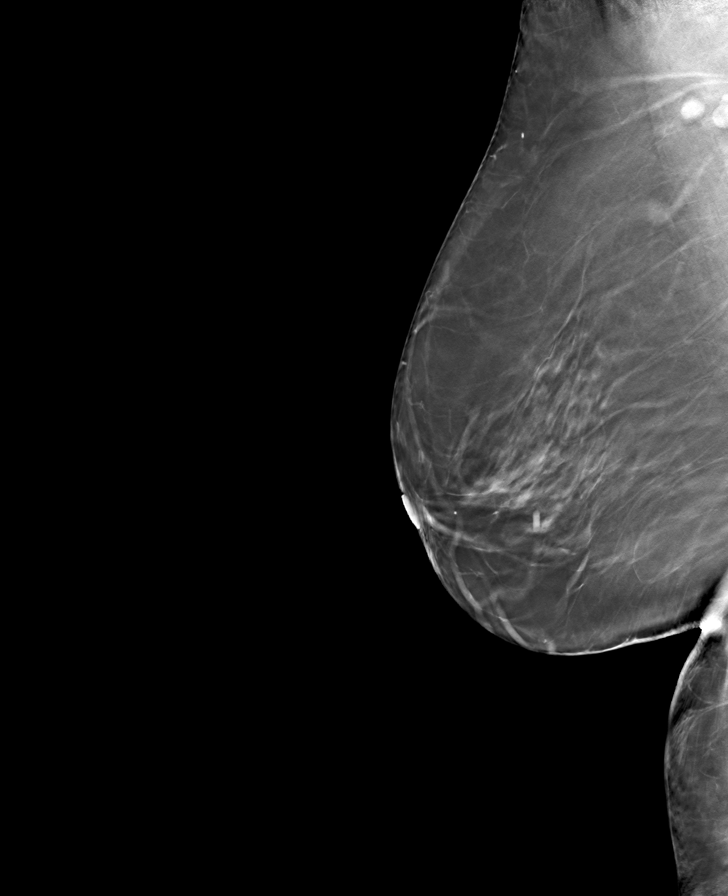

[R CC tomo · tomo slice 33/65.0]
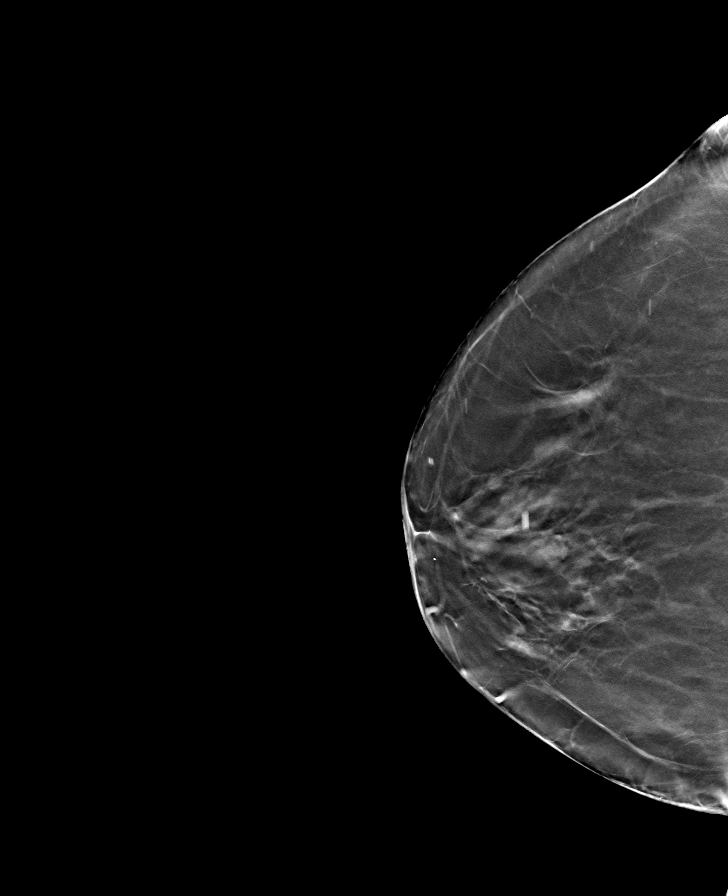

[8 of 24 positions shown; findings below may reference images not displayed]

ACR Breast Density Category b: There are scattered areas of
fibroglandular density.
FINDINGS: There are no findings suspicious for malignancy.
IMPRESSION: No mammographic evidence of malignancy. A result letter of this
screening mammogram will be mailed directly to the patient.

RECOMMENDATION:
Screening mammogram in one year. (Code:51-O-LD2)

BI-RADS CATEGORY  1: Negative.

## 2023-03-06 DIAGNOSIS — G3184 Mild cognitive impairment, so stated: Secondary | ICD-10-CM | POA: Diagnosis not present

## 2023-04-01 DIAGNOSIS — R7303 Prediabetes: Secondary | ICD-10-CM | POA: Diagnosis not present

## 2023-04-01 DIAGNOSIS — I10 Essential (primary) hypertension: Secondary | ICD-10-CM | POA: Diagnosis not present

## 2023-04-01 DIAGNOSIS — E039 Hypothyroidism, unspecified: Secondary | ICD-10-CM | POA: Diagnosis not present

## 2023-04-13 ENCOUNTER — Other Ambulatory Visit: Payer: Self-pay

## 2023-04-13 ENCOUNTER — Encounter: Payer: Self-pay | Admitting: *Deleted

## 2023-04-13 ENCOUNTER — Emergency Department: Payer: PPO

## 2023-04-13 ENCOUNTER — Emergency Department
Admission: EM | Admit: 2023-04-13 | Discharge: 2023-04-13 | Disposition: A | Discharge status: Home / Self Care | Payer: PPO | Attending: Emergency Medicine | Admitting: Emergency Medicine

## 2023-04-13 DIAGNOSIS — I2510 Atherosclerotic heart disease of native coronary artery without angina pectoris: Secondary | ICD-10-CM | POA: Insufficient documentation

## 2023-04-13 DIAGNOSIS — I10 Essential (primary) hypertension: Secondary | ICD-10-CM | POA: Insufficient documentation

## 2023-04-13 DIAGNOSIS — T679XXA Effect of heat and light, unspecified, initial encounter: Secondary | ICD-10-CM | POA: Diagnosis not present

## 2023-04-13 DIAGNOSIS — R531 Weakness: Secondary | ICD-10-CM

## 2023-04-13 DIAGNOSIS — E039 Hypothyroidism, unspecified: Secondary | ICD-10-CM | POA: Diagnosis not present

## 2023-04-13 DIAGNOSIS — I447 Left bundle-branch block, unspecified: Secondary | ICD-10-CM | POA: Diagnosis not present

## 2023-04-13 DIAGNOSIS — E860 Dehydration: Secondary | ICD-10-CM | POA: Diagnosis not present

## 2023-04-13 DIAGNOSIS — R42 Dizziness and giddiness: Secondary | ICD-10-CM | POA: Diagnosis not present

## 2023-04-13 DIAGNOSIS — R410 Disorientation, unspecified: Secondary | ICD-10-CM | POA: Diagnosis not present

## 2023-04-13 LAB — TROPONIN I (HIGH SENSITIVITY)
Troponin I (High Sensitivity): 8 ng/L (ref <18)
Troponin I (High Sensitivity): 10 ng/L (ref <18)

## 2023-04-13 LAB — BASIC METABOLIC PANEL
Anion gap: 10 (ref 5–15)
BUN: 10 mg/dL (ref 8–23)
CO2: 25 mmol/L (ref 22–32)
Calcium: 8.4 mg/dL — ABNORMAL LOW (ref 8.9–10.3)
Chloride: 99 mmol/L (ref 98–111)
Creatinine, Ser: 0.7 mg/dL (ref 0.44–1.00)
GFR, Estimated: >60 mL/min (ref >60)
Glucose, Bld: 154 mg/dL — ABNORMAL HIGH (ref 70–99)
Potassium: 3.3 mmol/L — ABNORMAL LOW (ref 3.5–5.1)
Sodium: 134 mmol/L — ABNORMAL LOW (ref 135–145)

## 2023-04-13 LAB — CBC
HCT: 38.6 % (ref 36.0–46.0)
Hemoglobin: 12.6 g/dL (ref 12.0–15.0)
MCH: 30.7 pg (ref 26.0–34.0)
MCHC: 32.6 g/dL (ref 30.0–36.0)
MCV: 94.1 fL (ref 80.0–100.0)
Platelets: 282 10*3/uL (ref 150–400)
RBC: 4.1 MIL/uL (ref 3.87–5.11)
RDW: 12.3 % (ref 11.5–15.5)
WBC: 8.7 10*3/uL (ref 4.0–10.5)
nRBC: 0 % (ref 0.0–0.2)

## 2023-04-13 LAB — URINALYSIS, ROUTINE W REFLEX MICROSCOPIC
Bacteria, UA: NONE SEEN
Bilirubin Urine: NEGATIVE
Glucose, UA: NEGATIVE mg/dL
Ketones, ur: NEGATIVE mg/dL
Leukocytes,Ua: NEGATIVE
Nitrite: NEGATIVE
Protein, ur: NEGATIVE mg/dL
Specific Gravity, Urine: 1.001 — ABNORMAL LOW (ref 1.005–1.030)
Squamous Epithelial / HPF: NONE SEEN /HPF (ref 0–5)
pH: 7 (ref 5.0–8.0)

## 2023-04-13 LAB — TSH: TSH: 1.885 u[IU]/mL (ref 0.350–4.500)

## 2023-04-13 MED ORDER — SODIUM CHLORIDE 0.9 % IV BOLUS
500.0000 mL | Freq: Once | INTRAVENOUS | Status: AC
  Administered 2023-04-13: 500 mL via INTRAVENOUS

## 2023-04-13 NOTE — ED Triage Notes (Signed)
Pt brought in via ems from home with weakness.  Ems report decreased appetite.  Iv in place.  Pt received 275 cc normal saline.  Fsbs 158 per ems   pt alert

## 2023-04-13 NOTE — ED Notes (Signed)
Pt provided with turkey sandwich tray 

## 2023-04-13 NOTE — Discharge Instructions (Signed)
Return to the ER for new, worsening, or persistent severe weakness, lightheadedness, confusion, or any other new or worsening symptoms that concern you.

## 2023-04-13 NOTE — ED Provider Notes (Signed)
[facility] Provider Note    Event Date/Time   First MD Initiated Contact with Patient 04/13/23 1740     (approximate)   History   Weakness   HPI  Jennifer Larsen is a 87 y.o. female  with a history of hypertension, CAD, hypothyroidism, prediabetes, and GERD who presents with generalized weakness.  The patient herself has no acute complaints and states she does not know why she was brought here.  The grandson states that the patient became acutely weak today, appeared unsteady and was unable to walk, was complaining of feeling unwell and having blurry vision.  He states that sometime over the weekend she had been working outside for several hours and then also had an episode (according to his father, the patient's son) appeared lightheaded and weak and was profusely sweating.  The grandson was concerned that the patient was dehydrated and could have heat exhaustion so had her brought to the ED.  The patient denies any headache, chest pain, nausea or vomiting, difficulty breathing, cough, lightheadedness, or other acute symptoms.  I reviewed the past medical records per the patient's most recent outpatient encounter was with Dr. Sherryll Burger from neurology on 5/3 for mild cognitive impairment.   Physical Exam   Triage Vital Signs: ED Triage Vitals  Enc Vitals Group     BP      Pulse      Resp      Temp      Temp src      SpO2      Weight      Height      Head Circumference      Peak Flow      Pain Score      Pain Loc      Pain Edu?      Excl. in GC?     Most recent vital signs: Vitals:   04/13/23 1900 04/13/23 2053  BP: (!) 128/53 (!) 152/64  Pulse: (!) 59 60  Resp: 14 18  Temp:  98.2 F (36.8 C)  SpO2: 97% 98%     General: Alert and oriented x 4, relatively well-appearing, no distress.  CV:  Good peripheral perfusion.  Normal heart sounds. Resp:  Normal effort.  Lungs CTAB. Abd:  Soft nontender.  No distention.  Other:  Slightly dry  mucous membranes.  EOMI.  PERRLA.  Cranial nerves III through XII grossly intact.  Motor intact in all extremities.   ED Results / Procedures / Treatments   Labs (all labs ordered are listed, but only abnormal results are displayed) Labs Reviewed  BASIC METABOLIC PANEL - Abnormal; Notable for the following components:      Result Value   Sodium 134 (*)    Potassium 3.3 (*)    Glucose, Bld 154 (*)    Calcium 8.4 (*)    All other components within normal limits  URINALYSIS, ROUTINE W REFLEX MICROSCOPIC - Abnormal; Notable for the following components:   Color, Urine COLORLESS (*)    APPearance CLEAR (*)    Specific Gravity, Urine 1.001 (*)    Hgb urine dipstick SMALL (*)    All other components within normal limits  CBC  TSH  TROPONIN I (HIGH SENSITIVITY)  TROPONIN I (HIGH SENSITIVITY)     EKG  ED ECG REPORT I, Dionne Bucy, the attending physician, personally viewed and interpreted this ECG.  Date: 04/13/2023 EKG Time: 1752 Rate: 61 Rhythm: normal sinus rhythm QRS Axis: normal Intervals: LBBB  ST/T Wave abnormalities: normal Narrative Interpretation: no evidence of acute ischemia    RADIOLOGY  Chest x-ray: I independently viewed and interpreted the images; there is no focal consolidation or edema  CT head: No ICH or other acute abnormality   PROCEDURES:  Critical Care performed: No  Procedures   MEDICATIONS ORDERED IN ED: Medications  sodium chloride 0.9 % bolus 500 mL (0 mLs Intravenous Stopped 04/13/23 2052)     IMPRESSION / MDM / ASSESSMENT AND PLAN / ED COURSE  I reviewed the triage vital signs and the nursing notes.  87 year old female with PMH as noted above presents with generalized weakness acute onset today as well as an episode of near syncope in the last couple of days.  The patient denies any acute complaints.  On exam she is hypertensive with otherwise normal vital signs.  Neurologic exam is nonfocal.  Exam is otherwise unremarkable  for acute findings.  Differential diagnosis includes, but is not limited to, dehydration/hypovolemia, electrolyte abnormality, AKI, other metabolic cause, UTI or other infection, less likely cardiac or primary CNS etiology.  We will obtain CT head, chest x-ray, lab workup, give a fluid bolus, and reassess.  Patient's presentation is most consistent with acute presentation with potential threat to life or bodily function.  The patient is on the cardiac monitor to evaluate for evidence of arrhythmia and/or significant heart rate changes.  ----------------------------------------- 8:49 PM on 04/13/2023 -----------------------------------------  CT head and chest x-ray show no acute findings.  Lab workup is unremarkable.  Troponin is negative.  Given the duration of the symptoms since yesterday there is no indication for repeat.  Electrolytes are unremarkable.  There is no leukocytosis or anemia.  Urinalysis is negative.  On reassessment, the patient remains in symptomatic.  Her daughter is here now and is able to give additional history.  She states that the patient was working heavily outside yesterday and this is when the symptoms started.  She believes that the patient became dehydrated and did not drink enough water last night or today.  I did consider whether the patient may benefit from admission given the generalized weakness and likely dehydration.  However, the patient very strongly prefers to go home and the daughter agrees with this.  I counseled them on the results of the workup.  I feel that given the unremarkable labs and stable vital signs as well as the patient's lack of any acute symptoms, she is stable for discharge at this time.  I gave strict return precautions and she expressed understanding.   FINAL CLINICAL IMPRESSION(S) / ED DIAGNOSES   Final diagnoses:  Weakness  Dehydration     Rx / DC Orders   ED Discharge Orders     None        Note:  This document  was prepared using Dragon voice recognition software and may include unintentional dictation errors.    Dionne Bucy, MD 04/13/23 2239

## 2023-04-13 NOTE — ED Notes (Signed)
Pt provided discharge instructions and prescription information. Pt was given the opportunity to ask questions and questions were answered.   

## 2023-04-17 ENCOUNTER — Telehealth: Payer: Self-pay

## 2023-04-17 NOTE — Telephone Encounter (Signed)
Transition Care Management Follow-up Telephone Call Date of discharge and from where: 04/13/2023 [facility] How have you been since you were released from the hospital? Patient is feeling better, but is still weak. Any questions or concerns? No  Items Reviewed: Did the pt receive and understand the discharge instructions provided? Yes  Medications obtained and verified? Yes  Other? No  Any new allergies since your discharge? No  Dietary orders reviewed? Yes Do you have support at home? Yes   Follow up appointments reviewed:  PCP Hospital f/u appt confirmed? Yes  Scheduled to see Maurine Minister, MD on 05/19/2023 @ [facility]. Specialist Hospital f/u appt confirmed? No  Scheduled to see  on  @ . Are transportation arrangements needed? No  If their condition worsens, is the pt aware to call PCP or go to the Emergency Dept.? Yes Was the patient provided with contact information for the PCP's office or ED? Yes Was to pt encouraged to call back with questions or concerns? Yes  Reneisha Stilley [facility]  [facility] Population Health Community Resource Care Guide   [email or web address]  ?? [phone]   Website: [email or web address]  [email or web address]

## 2023-05-04 DIAGNOSIS — H26492 Other secondary cataract, left eye: Secondary | ICD-10-CM | POA: Diagnosis not present

## 2023-05-04 DIAGNOSIS — H35373 Puckering of macula, bilateral: Secondary | ICD-10-CM | POA: Diagnosis not present

## 2023-05-04 DIAGNOSIS — Z961 Presence of intraocular lens: Secondary | ICD-10-CM | POA: Diagnosis not present

## 2023-05-04 DIAGNOSIS — Z0100 Encounter for examination of eyes and vision without abnormal findings: Secondary | ICD-10-CM | POA: Diagnosis not present

## 2023-05-04 DIAGNOSIS — H2511 Age-related nuclear cataract, right eye: Secondary | ICD-10-CM | POA: Diagnosis not present

## 2023-05-18 DIAGNOSIS — H2511 Age-related nuclear cataract, right eye: Secondary | ICD-10-CM | POA: Diagnosis not present

## 2023-05-19 ENCOUNTER — Other Ambulatory Visit: Payer: Self-pay | Admitting: Physician Assistant

## 2023-05-19 DIAGNOSIS — E039 Hypothyroidism, unspecified: Secondary | ICD-10-CM | POA: Diagnosis not present

## 2023-05-19 DIAGNOSIS — Z0000 Encounter for general adult medical examination without abnormal findings: Secondary | ICD-10-CM | POA: Diagnosis not present

## 2023-05-19 DIAGNOSIS — Z780 Asymptomatic menopausal state: Secondary | ICD-10-CM | POA: Diagnosis not present

## 2023-05-19 DIAGNOSIS — Z1231 Encounter for screening mammogram for malignant neoplasm of breast: Secondary | ICD-10-CM

## 2023-05-19 DIAGNOSIS — I10 Essential (primary) hypertension: Secondary | ICD-10-CM | POA: Diagnosis not present

## 2023-05-19 DIAGNOSIS — K219 Gastro-esophageal reflux disease without esophagitis: Secondary | ICD-10-CM | POA: Diagnosis not present

## 2023-05-19 DIAGNOSIS — M8580 Other specified disorders of bone density and structure, unspecified site: Secondary | ICD-10-CM | POA: Diagnosis not present

## 2023-05-19 DIAGNOSIS — G3184 Mild cognitive impairment, so stated: Secondary | ICD-10-CM | POA: Diagnosis not present

## 2023-05-19 DIAGNOSIS — R7303 Prediabetes: Secondary | ICD-10-CM | POA: Diagnosis not present

## 2023-06-09 DIAGNOSIS — H26492 Other secondary cataract, left eye: Secondary | ICD-10-CM | POA: Diagnosis not present

## 2023-06-11 ENCOUNTER — Encounter: Payer: Self-pay | Admitting: Ophthalmology

## 2023-06-15 NOTE — Discharge Instructions (Signed)

## 2023-06-16 ENCOUNTER — Encounter: Payer: Self-pay | Admitting: Ophthalmology

## 2023-06-16 NOTE — Anesthesia Preprocedure Evaluation (Signed)
Anesthesia Evaluation  Patient identified by MRN, date of birth, ID band Patient awake    Reviewed: Allergy & Precautions, H&P , NPO status , Patient's Chart, lab work & pertinent test results  Airway Mallampati: III  TM Distance: <3 FB Neck ROM: Full    Dental no notable dental hx. (+) Upper Dentures, Implants Upper dentures, lower implants, none loose, all intact:   Pulmonary neg pulmonary ROS   Pulmonary exam normal breath sounds clear to auscultation       Cardiovascular hypertension, + Past MI  Normal cardiovascular exam+ dysrhythmias  Rhythm:Regular Rate:Normal     Neuro/Psych  PSYCHIATRIC DISORDERS     Dementia  Neuromuscular disease negative neurological ROS  negative psych ROS   GI/Hepatic negative GI ROS, Neg liver ROS, hiatal hernia,GERD  ,,  Endo/Other  negative endocrine ROSHypothyroidism    Renal/GU negative Renal ROS  negative genitourinary   Musculoskeletal negative musculoskeletal ROS (+) Arthritis ,    Abdominal   Peds negative pediatric ROS (+)  Hematology negative hematology ROS (+)   Anesthesia Other Findings Hypertension  Thyroid disease GERD (gastroesophageal reflux disease) Dysrhythmia Myocardial infarction (HCC)  Hypothyroidism Hard of hearing  Arthritis Wears dentures  Hiatal hernia LBBB (left bundle branch block) Pre-diabetes Dementia (HCC)  Mild cognitive impairment   Reproductive/Obstetrics negative OB ROS                              Anesthesia Physical Anesthesia Plan  ASA: 3  Anesthesia Plan: MAC   Post-op Pain Management:    Induction: Intravenous  PONV Risk Score and Plan:   Airway Management Planned: Natural Airway and Nasal Cannula  Additional Equipment:   Intra-op Plan:   Post-operative Plan:   Informed Consent: I have reviewed the patients History and Physical, chart, labs and discussed the procedure including the risks,  benefits and alternatives for the proposed anesthesia with the patient or authorized representative who has indicated his/her understanding and acceptance.     Dental Advisory Given  Plan Discussed with: Anesthesiologist, CRNA and Surgeon  Anesthesia Plan Comments: (Patient consented for risks of anesthesia including but not limited to:  - adverse reactions to medications - damage to eyes, teeth, lips or other oral mucosa - nerve damage due to positioning  - sore throat or hoarseness - Damage to heart, brain, nerves, lungs, other parts of body or loss of life  Patient voiced understanding.)        Anesthesia Quick Evaluation

## 2023-06-17 ENCOUNTER — Encounter: Payer: Self-pay | Admitting: Ophthalmology

## 2023-06-17 ENCOUNTER — Ambulatory Visit: Payer: Self-pay | Admitting: Anesthesiology

## 2023-06-17 ENCOUNTER — Other Ambulatory Visit: Payer: Self-pay

## 2023-06-17 ENCOUNTER — Ambulatory Visit: Admission: RE | Admit: 2023-06-17 | Payer: PPO | Source: Home / Self Care | Admitting: Ophthalmology

## 2023-06-17 ENCOUNTER — Ambulatory Visit: Payer: PPO | Admitting: Anesthesiology

## 2023-06-17 ENCOUNTER — Encounter
Admission: RE | Disposition: A | Discharge status: Home / Self Care | Payer: Self-pay | Source: Home / Self Care | Attending: Ophthalmology

## 2023-06-17 DIAGNOSIS — H269 Unspecified cataract: Secondary | ICD-10-CM | POA: Diagnosis not present

## 2023-06-17 DIAGNOSIS — H2511 Age-related nuclear cataract, right eye: Secondary | ICD-10-CM | POA: Insufficient documentation

## 2023-06-17 DIAGNOSIS — I252 Old myocardial infarction: Secondary | ICD-10-CM | POA: Insufficient documentation

## 2023-06-17 DIAGNOSIS — K449 Diaphragmatic hernia without obstruction or gangrene: Secondary | ICD-10-CM | POA: Diagnosis not present

## 2023-06-17 DIAGNOSIS — I10 Essential (primary) hypertension: Secondary | ICD-10-CM | POA: Insufficient documentation

## 2023-06-17 DIAGNOSIS — M1990 Unspecified osteoarthritis, unspecified site: Secondary | ICD-10-CM | POA: Insufficient documentation

## 2023-06-17 DIAGNOSIS — E039 Hypothyroidism, unspecified: Secondary | ICD-10-CM | POA: Diagnosis not present

## 2023-06-17 DIAGNOSIS — F0390 Unspecified dementia without behavioral disturbance: Secondary | ICD-10-CM | POA: Insufficient documentation

## 2023-06-17 DIAGNOSIS — G709 Myoneural disorder, unspecified: Secondary | ICD-10-CM | POA: Diagnosis not present

## 2023-06-17 HISTORY — DX: Sensorineural hearing loss, bilateral: H90.3

## 2023-06-17 HISTORY — DX: Hypothyroidism, unspecified: E03.9

## 2023-06-17 HISTORY — DX: Mild cognitive impairment of uncertain or unknown etiology: G31.84

## 2023-06-17 HISTORY — DX: Unspecified dementia, unspecified severity, without behavioral disturbance, psychotic disturbance, mood disturbance, and anxiety: F03.90

## 2023-06-17 HISTORY — PX: CATARACT EXTRACTION W/PHACO: SHX586

## 2023-06-17 SURGERY — PHACOEMULSIFICATION, CATARACT, WITH IOL INSERTION
Anesthesia: Monitor Anesthesia Care | Site: Eye | Laterality: Right

## 2023-06-17 MED ORDER — SIGHTPATH DOSE#1 BSS IO SOLN
INTRAOCULAR | Status: DC | PRN
  Administered 2023-06-17: 15 mL

## 2023-06-17 MED ORDER — FENTANYL CITRATE (PF) 100 MCG/2ML IJ SOLN
INTRAMUSCULAR | Status: DC | PRN
  Administered 2023-06-17: 50 ug via INTRAVENOUS

## 2023-06-17 MED ORDER — SIGHTPATH DOSE#1 BSS IO SOLN
INTRAOCULAR | Status: DC | PRN
  Administered 2023-06-17: 105 mL via OPHTHALMIC

## 2023-06-17 MED ORDER — ARMC OPHTHALMIC DILATING DROPS
1.0000 | OPHTHALMIC | Status: DC | PRN
  Administered 2023-06-17 (×2): 1 via OPHTHALMIC

## 2023-06-17 MED ORDER — SIGHTPATH DOSE#1 NA HYALUR & NA CHOND-NA HYALUR IO KIT
PACK | INTRAOCULAR | Status: DC | PRN
  Administered 2023-06-17: 1 via OPHTHALMIC

## 2023-06-17 MED ORDER — SIGHTPATH DOSE#1 BSS IO SOLN
INTRAOCULAR | Status: DC | PRN
  Administered 2023-06-17: 1 mL

## 2023-06-17 MED ORDER — BRIMONIDINE TARTRATE-TIMOLOL 0.2-0.5 % OP SOLN
OPHTHALMIC | Status: DC | PRN
  Administered 2023-06-17: 1 [drp] via OPHTHALMIC

## 2023-06-17 MED ORDER — TETRACAINE HCL 0.5 % OP SOLN
1.0000 [drp] | OPHTHALMIC | Status: DC | PRN
  Administered 2023-06-17 (×3): 1 [drp] via OPHTHALMIC

## 2023-06-17 MED ORDER — LACTATED RINGERS IV SOLN: INTRAVENOUS | Status: DC

## 2023-06-17 MED ORDER — MOXIFLOXACIN HCL 0.5 % OP SOLN
OPHTHALMIC | Status: DC | PRN
  Administered 2023-06-17: .2 mL via OPHTHALMIC

## 2023-06-17 SURGICAL SUPPLY — 18 items
CANNULA ANT/CHMB 27G (MISCELLANEOUS) IMPLANT
CANNULA ANT/CHMB 27GA (MISCELLANEOUS)
CATARACT SUITE SIGHTPATH (MISCELLANEOUS) ×1
FEE CATARACT SUITE SIGHTPATH (MISCELLANEOUS) ×1 IMPLANT
GLOVE SRG 8 PF TXTR STRL LF DI (GLOVE) ×1 IMPLANT
GLOVE SURG ENC TEXT LTX SZ7.5 (GLOVE) ×1 IMPLANT
GLOVE SURG GAMMEX PI TX LF 7.5 (GLOVE) IMPLANT
GLOVE SURG UNDER POLY LF SZ8 (GLOVE) ×1
LENS IOL TECNIS EYHANCE 19.0 (Intraocular Lens) IMPLANT
NDL FILTER BLUNT 18X1 1/2 (NEEDLE) ×1 IMPLANT
NDL RETROBULBAR .5 NSTRL (NEEDLE) IMPLANT
NEEDLE FILTER BLUNT 18X1 1/2 (NEEDLE) ×1
PACK VIT ANT 23G (MISCELLANEOUS) IMPLANT
RING MALYGIN 7.0 (MISCELLANEOUS) IMPLANT
SUT ETHILON 10-0 CS-B-6CS-B-6 (SUTURE)
SUT VICRYL 9 0 (SUTURE) IMPLANT
SUTURE EHLN 10-0 CS-B-6CS-B-6 (SUTURE) IMPLANT
SYR 3ML LL SCALE MARK (SYRINGE) ×1 IMPLANT

## 2023-06-17 NOTE — H&P (Signed)
[facility]   Primary Care Physician:  Patrice Paradise, MD Ophthalmologist: Dr. Lockie Mola  Pre-Procedure History & Physical: HPI:  Jennifer Larsen is a 87 y.o. female here for ophthalmic surgery.   Past Medical History:  Diagnosis Date   Acquired hypothyroidism    Arthritis    fingers   Bilateral sensorineural hearing loss    Dementia (HCC)    Dysrhythmia 03/2017   left bundle branch block   GERD (gastroesophageal reflux disease)    Hard of hearing    Hiatal hernia    Hypertension    Hypothyroidism    LBBB (left bundle branch block)    Mild cognitive impairment    Myocardial infarction (HCC)    ekg shows left bundle branch block per dr. Lady Gary, NO heart attack   Pre-diabetes    Thyroid disease    Wears dentures    full upper, implants on bottom    Past Surgical History:  Procedure Laterality Date   ABDOMINAL HYSTERECTOMY     BREAST CYST ASPIRATION Left 03/24/2014   BUNIONECTOMY     CATARACT EXTRACTION W/PHACO Left 09/16/2017   Procedure: CATARACT EXTRACTION PHACO AND INTRAOCULAR LENS PLACEMENT (IOC) LEFT COMPLICATED;  Surgeon: Lockie Mola, MD;  Location: [facility];  Service: Ophthalmology;  Laterality: Left;   DENTAL SURGERY     implants on lower teeth   ESOPHAGOGASTRODUODENOSCOPY (EGD) WITH PROPOFOL N/A 04/05/2018   Procedure: ESOPHAGOGASTRODUODENOSCOPY (EGD) WITH PROPOFOL;  Surgeon: Scot Jun, MD;  Location: [facility] END[facility];  Service: Endoscopy;  Laterality: N/A;   OPEN REDUCTION INTERNAL FIXATION (ORIF) DISTAL RADIAL FRACTURE Left 03/17/2017   Procedure: OPEN REDUCTION INTERNAL FIXATION (ORIF) DISTAL RADIAL FRACTURE;  Surgeon: Kennedy Bucker, MD;  Location: [facility] ORS;  Service: Orthopedics;  Laterality: Left;    Prior to Admission medications   Medication Sig Start Date End Date Taking? Authorizing Provider  amLODipine (NORVASC) 2.5 MG tablet Take 2 tablets by mouth daily. 10/24/16  Yes [provider]   aspirin 81 MG chewable tablet Chew 40.5 mg by mouth daily. Takes 1/2 tab   Yes [provider]  donepezil (ARICEPT) 10 MG tablet Take 10 mg by mouth at bedtime.   Yes [provider]  memantine (NAMENDA) 5 MG tablet Take 5 mg by mouth 2 (two) times daily.   Yes [provider]  Multiple Vitamin (MULTI-VITAMINS) TABS Take 1 tablet by mouth daily.   Yes [provider]  SYNTHROID 100 MCG tablet Take 1 tablet by mouth daily. 10/24/16  Yes [provider]  Biotin 13086 MCG TBDP Take 5,000 mcg by mouth daily. Patient not taking: Reported on 06/11/2023    [provider]  Calcium Carb-Cholecalciferol (CALCIUM CARBONATE-VITAMIN D3 PO) Take by mouth. Patient not taking: Reported on 06/11/2023    [provider]  Cholecalciferol 1000 units tablet Take 1,000 Units by mouth daily. Patient not taking: Reported on 06/11/2023    [provider]  Coenzyme Q10 (COQ10 PO) Take 1 tablet by mouth daily. Patient not taking: Reported on 06/11/2023    [provider]  Glucosamine HCl 1000 MG TABS Take 1,000 mg by mouth daily. Patient not taking: Reported on 06/11/2023    [provider]  lansoprazole (PREVACID) 30 MG capsule Take 1 capsule by mouth daily. Patient not taking: Reported on 06/11/2023 09/29/16   [provider]  loperamide (IMODIUM A-D) 2 MG tablet Take 1 tablet (2 mg total) by mouth 4 (four) times daily as needed for diarrhea or loose  stools. Patient not taking: Reported on 06/11/2023 05/22/20   Nita Sickle, MD  Magnesium 250 MG TABS Take 250 mg by mouth daily. Patient not taking: Reported on 06/11/2023    [provider]  mupirocin ointment (BACTROBAN) 2 % Apply to irritated areas on skin qd-bid Patient not taking: Reported on 06/11/2023 03/20/21   Deirdre Evener, MD  ondansetron (ZOFRAN ODT) 4 MG disintegrating tablet Take 1 tablet (4 mg total) by mouth every 8 (eight) hours as needed. Patient not  taking: Reported on 06/11/2023 05/22/20   Nita Sickle, MD  oxyCODONE (OXY IR/ROXICODONE) 5 MG immediate release tablet Take 1-2 tablets (5-10 mg total) by mouth every 3 (three) hours as needed for breakthrough pain. Patient not taking: Reported on 06/11/2023 03/18/17   Evon Slack, PA-C  phenazopyridine (PYRIDIUM) 200 MG tablet Take 1 tablet (200 mg total) by mouth 3 (three) times daily as needed for pain. Patient not taking: Reported on 06/11/2023 05/12/21   Kem Boroughs B, FNP  thiamine (VITAMIN B-1) 100 MG tablet Take 100 mg by mouth daily. Patient not taking: Reported on 06/11/2023    [provider]  vitamin B-12 (CYANOCOBALAMIN) 250 MCG tablet Take 250 mcg by mouth daily. Patient not taking: Reported on 06/11/2023    [provider]    Allergies as of 05/18/2023 - Reviewed 04/13/2023  Allergen Reaction Noted   Cephalexin Rash 03/28/2015   Clarithromycin Other (See Comments) 03/28/2015   Macrolides and ketolides Other (See Comments) 05/09/2014   Ofloxacin Other (See Comments) 03/28/2015    Family History  Problem Relation Age of Onset   Breast cancer Mother 42   Breast cancer Maternal Aunt 45    Social History   Socioeconomic History   Marital status: Divorced    Spouse name: Not on file   Number of children: Not on file   Years of education: Not on file   Highest education level: Not on file  Occupational History   Not on file  Tobacco Use   Smoking status: Never   Smokeless tobacco: Never  Vaping Use   Vaping status: Never Used  Substance and Sexual Activity   Alcohol use: No    Comment: occasional social drink, about once a year   Drug use: No   Sexual activity: Not on file  Other Topics Concern   Not on file  Social History Narrative   Not on file   Social Determinants of Health   Financial Resource Strain: Low Risk  (05/19/2023)   Received from [facility]   Overall Financial Resource Strain (CARDIA)    Difficulty of  Paying Living Expenses: Not hard at all  Food Insecurity: No Food Insecurity (05/19/2023)   Received from [facility]   Hunger Vital Sign    Worried About Running Out of Food in the Last Year: Never true    Ran Out of Food in the Last Year: Never true  Transportation Needs: No Transportation Needs (05/19/2023)   Received from [facility] - Transportation    In the past 12 months, has lack of transportation kept you from medical appointments or from getting medications?: No    Lack of Transportation (Non-Medical): No  Physical Activity: Not on file  Stress: Not on file  Social Connections: Not on file  Intimate Partner Violence: Not on file    Review of Systems: See HPI, otherwise negative ROS  Physical Exam: BP (!) 165/78   Pulse 61  Temp 97.9 F (36.6 C) (Temporal)   Resp 16   Ht 5' (1.524 m)   Wt 63.5 kg   SpO2 97%   BMI 27.34 kg/m  General:   Alert,  pleasant and cooperative in NAD Head:  Normocephalic and atraumatic. Lungs:  Clear to auscultation.    Heart:  Regular rate and rhythm.   Impression/Plan: Mearl Latin is here for ophthalmic surgery.  Risks, benefits, limitations, and alternatives regarding ophthalmic surgery have been reviewed with the patient.  Questions have been answered.  All parties agreeable.   Lockie Mola, MD  06/17/2023, 8:32 AM\

## 2023-06-17 NOTE — Transfer of Care (Signed)
Immediate Anesthesia Transfer of Care Note  Patient: Jennifer Larsen  Procedure(s) Performed: CATARACT EXTRACTION PHACO AND INTRAOCULAR LENS PLACEMENT (IOC) RIGHT MALYUGIN (Right: Eye)  Patient Location: PACU  Anesthesia Type: MAC  Level of Consciousness: awake, alert  and patient cooperative  Airway and Oxygen Therapy: Patient Spontanous Breathing and Patient connected to supplemental oxygen  Post-op Assessment: Post-op Vital signs reviewed, Patient's Cardiovascular Status Stable, Respiratory Function Stable, Patent Airway and No signs of Nausea or vomiting  Post-op Vital Signs: Reviewed and stable  Complications: No notable events documented.

## 2023-06-17 NOTE — Anesthesia Postprocedure Evaluation (Signed)
Anesthesia Post Note  Patient: Mearl Latin  Procedure(s) Performed: CATARACT EXTRACTION PHACO AND INTRAOCULAR LENS PLACEMENT (IOC) RIGHT MALYUGIN (Right: Eye)  Patient location during evaluation: PACU Anesthesia Type: MAC Level of consciousness: awake and alert Pain management: pain level controlled Vital Signs Assessment: post-procedure vital signs reviewed and stable Respiratory status: spontaneous breathing, nonlabored ventilation, respiratory function stable and patient connected to nasal cannula oxygen Cardiovascular status: stable and blood pressure returned to baseline Postop Assessment: no apparent nausea or vomiting Anesthetic complications: no   No notable events documented.   Last Vitals:  Vitals:   06/17/23 0952 06/17/23 0957  BP: (!) 145/62 (!) 150/69  Pulse: (!) 52 (!) 57  Resp: 11 11  Temp: 36.6 C 36.6 C  SpO2: 98% 97%    Last Pain:  Vitals:   06/17/23 0957  TempSrc:   PainSc: 0-No pain                 Omesha Bowerman C Mikaylee Arseneau

## 2023-06-17 NOTE — Op Note (Signed)
LOCATION:  [facility]   PREOPERATIVE DIAGNOSIS:    Nuclear sclerotic cataract right eye. H25.11   POSTOPERATIVE DIAGNOSIS:  Nuclear sclerotic cataract right eye.     PROCEDURE:  Phacoemusification with posterior chamber intraocular lens placement of the right eye   ULTRASOUND TIME: Procedure(s) with comments: CATARACT EXTRACTION PHACO AND INTRAOCULAR LENS PLACEMENT (IOC) RIGHT MALYUGIN (Right) - 12.63 1:33.3  LENS:   Implant Name Type Inv. Item Serial No. Manufacturer Lot No. LRB No. Used Action  LENS IOL TECNIS EYHANCE 19.0 - [id number] Intraocular Lens LENS IOL TECNIS EYHANCE 19.0 [id number] [employer]  Right 1 Implanted         SURGEON:  Deirdre Evener, MD   ANESTHESIA:  Topical with tetracaine drops and 2% Xylocaine jelly, augmented with 1% preservative-free intracameral lidocaine.    COMPLICATIONS:  None.   DESCRIPTION OF PROCEDURE:  The patient was identified in the holding room and transported to the operating room and placed in the supine position under the operating microscope.  The right eye was identified as the operative eye and it was prepped and draped in the usual sterile ophthalmic fashion.   A 1 millimeter clear-corneal paracentesis was made at the 12:00 position.  0.5 ml of preservative-free 1% lidocaine was injected into the anterior chamber. The anterior chamber was filled with Viscoat viscoelastic.  A 2.4 millimeter keratome was used to make a near-clear corneal incision at the 9:00 position.  A curvilinear capsulorrhexis was made with a cystotome and capsulorrhexis forceps.  Balanced salt solution was used to hydrodissect and hydrodelineate the nucleus.   Phacoemulsification was then used in stop and chop fashion to remove the lens nucleus and epinucleus.  The remaining cortex was then removed using the irrigation and aspiration handpiece. Provisc was then placed into the capsular bag to distend it for lens placement.  A lens was then injected  into the capsular bag.  The remaining viscoelastic was aspirated.   Wounds were hydrated with balanced salt solution.  The anterior chamber was inflated to a physiologic pressure with balanced salt solution.  No wound leaks were noted. Vigamox 0.2 ml of a 1mg  per ml solution was injected into the anterior chamber for a dose of 0.2 mg of intracameral antibiotic at the completion of the case.   Timolol and Brimonidine drops were applied to the eye.  The patient was taken to the recovery room in stable condition without complications of anesthesia or surgery.   Charliegh Vasudevan 06/17/2023, 9:52 AM

## 2023-06-18 ENCOUNTER — Encounter: Payer: Self-pay | Admitting: Ophthalmology

## 2023-07-14 DIAGNOSIS — Z961 Presence of intraocular lens: Secondary | ICD-10-CM | POA: Diagnosis not present

## 2023-07-22 DIAGNOSIS — H903 Sensorineural hearing loss, bilateral: Secondary | ICD-10-CM | POA: Diagnosis not present

## 2023-07-22 DIAGNOSIS — H6123 Impacted cerumen, bilateral: Secondary | ICD-10-CM | POA: Diagnosis not present

## 2023-07-31 ENCOUNTER — Ambulatory Visit
Admission: RE | Admit: 2023-07-31 | Discharge: 2023-07-31 | Disposition: A | Discharge status: Home / Self Care | Payer: PPO | Source: Ambulatory Visit | Attending: Physician Assistant | Admitting: Physician Assistant

## 2023-07-31 DIAGNOSIS — Z1231 Encounter for screening mammogram for malignant neoplasm of breast: Secondary | ICD-10-CM | POA: Insufficient documentation

## 2023-09-11 DIAGNOSIS — H9193 Unspecified hearing loss, bilateral: Secondary | ICD-10-CM | POA: Diagnosis not present

## 2023-09-11 DIAGNOSIS — G3184 Mild cognitive impairment, so stated: Secondary | ICD-10-CM | POA: Diagnosis not present

## 2023-09-11 DIAGNOSIS — I10 Essential (primary) hypertension: Secondary | ICD-10-CM | POA: Diagnosis not present

## 2023-09-16 ENCOUNTER — Ambulatory Visit: Payer: PPO | Attending: Student | Admitting: Speech Pathology

## 2023-09-22 DIAGNOSIS — E039 Hypothyroidism, unspecified: Secondary | ICD-10-CM | POA: Diagnosis not present

## 2023-09-22 DIAGNOSIS — G3184 Mild cognitive impairment, so stated: Secondary | ICD-10-CM | POA: Diagnosis not present

## 2023-09-22 DIAGNOSIS — R7303 Prediabetes: Secondary | ICD-10-CM | POA: Diagnosis not present

## 2023-09-22 DIAGNOSIS — I10 Essential (primary) hypertension: Secondary | ICD-10-CM | POA: Diagnosis not present

## 2023-09-22 DIAGNOSIS — K219 Gastro-esophageal reflux disease without esophagitis: Secondary | ICD-10-CM | POA: Diagnosis not present

## 2024-01-14 DIAGNOSIS — M25572 Pain in left ankle and joints of left foot: Secondary | ICD-10-CM | POA: Diagnosis not present

## 2024-01-14 DIAGNOSIS — E039 Hypothyroidism, unspecified: Secondary | ICD-10-CM | POA: Diagnosis not present

## 2024-01-14 DIAGNOSIS — R7303 Prediabetes: Secondary | ICD-10-CM | POA: Diagnosis not present

## 2024-01-14 DIAGNOSIS — I10 Essential (primary) hypertension: Secondary | ICD-10-CM | POA: Diagnosis not present

## 2024-01-18 DIAGNOSIS — M25475 Effusion, left foot: Secondary | ICD-10-CM | POA: Diagnosis not present

## 2024-01-25 DIAGNOSIS — M25572 Pain in left ankle and joints of left foot: Secondary | ICD-10-CM | POA: Diagnosis not present

## 2024-01-25 DIAGNOSIS — M25475 Effusion, left foot: Secondary | ICD-10-CM | POA: Diagnosis not present

## 2024-02-10 DIAGNOSIS — M25472 Effusion, left ankle: Secondary | ICD-10-CM | POA: Diagnosis not present

## 2024-02-10 DIAGNOSIS — G3184 Mild cognitive impairment, so stated: Secondary | ICD-10-CM | POA: Diagnosis not present

## 2024-02-10 DIAGNOSIS — H9193 Unspecified hearing loss, bilateral: Secondary | ICD-10-CM | POA: Diagnosis not present

## 2024-03-08 DIAGNOSIS — T7840XA Allergy, unspecified, initial encounter: Secondary | ICD-10-CM | POA: Diagnosis not present

## 2024-04-13 DIAGNOSIS — L304 Erythema intertrigo: Secondary | ICD-10-CM | POA: Diagnosis not present

## 2024-04-13 DIAGNOSIS — R21 Rash and other nonspecific skin eruption: Secondary | ICD-10-CM | POA: Diagnosis not present

## 2024-04-14 ENCOUNTER — Ambulatory Visit: Admitting: Dermatology

## 2024-05-25 ENCOUNTER — Other Ambulatory Visit: Payer: Self-pay | Admitting: Physician Assistant

## 2024-05-25 DIAGNOSIS — Z1231 Encounter for screening mammogram for malignant neoplasm of breast: Secondary | ICD-10-CM

## 2024-05-25 DIAGNOSIS — E039 Hypothyroidism, unspecified: Secondary | ICD-10-CM | POA: Diagnosis not present

## 2024-05-25 DIAGNOSIS — G3184 Mild cognitive impairment, so stated: Secondary | ICD-10-CM | POA: Diagnosis not present

## 2024-05-25 DIAGNOSIS — R799 Abnormal finding of blood chemistry, unspecified: Secondary | ICD-10-CM | POA: Diagnosis not present

## 2024-05-25 DIAGNOSIS — K219 Gastro-esophageal reflux disease without esophagitis: Secondary | ICD-10-CM | POA: Diagnosis not present

## 2024-05-25 DIAGNOSIS — R7303 Prediabetes: Secondary | ICD-10-CM | POA: Diagnosis not present

## 2024-05-25 DIAGNOSIS — Z780 Asymptomatic menopausal state: Secondary | ICD-10-CM | POA: Diagnosis not present

## 2024-05-25 DIAGNOSIS — Z0000 Encounter for general adult medical examination without abnormal findings: Secondary | ICD-10-CM | POA: Diagnosis not present

## 2024-05-25 DIAGNOSIS — M8580 Other specified disorders of bone density and structure, unspecified site: Secondary | ICD-10-CM | POA: Diagnosis not present

## 2024-05-25 DIAGNOSIS — I10 Essential (primary) hypertension: Secondary | ICD-10-CM | POA: Diagnosis not present

## 2024-07-22 DIAGNOSIS — R3989 Other symptoms and signs involving the genitourinary system: Secondary | ICD-10-CM | POA: Diagnosis not present

## 2024-08-15 DIAGNOSIS — G3184 Mild cognitive impairment, so stated: Secondary | ICD-10-CM | POA: Diagnosis not present

## 2024-11-13 ENCOUNTER — Emergency Department

## 2024-11-13 ENCOUNTER — Other Ambulatory Visit: Payer: Self-pay

## 2024-11-13 ENCOUNTER — Emergency Department: Admission: EM | Admit: 2024-11-13 | Discharge: 2024-11-13 | Disposition: A | Discharge status: Home / Self Care

## 2024-11-13 DIAGNOSIS — F03B0 Unspecified dementia, moderate, without behavioral disturbance, psychotic disturbance, mood disturbance, and anxiety: Secondary | ICD-10-CM

## 2024-11-13 DIAGNOSIS — R531 Weakness: Secondary | ICD-10-CM | POA: Insufficient documentation

## 2024-11-13 DIAGNOSIS — R410 Disorientation, unspecified: Secondary | ICD-10-CM | POA: Insufficient documentation

## 2024-11-13 DIAGNOSIS — F0390 Unspecified dementia without behavioral disturbance: Secondary | ICD-10-CM | POA: Diagnosis not present

## 2024-11-13 LAB — COMPREHENSIVE METABOLIC PANEL WITH GFR
ALT: 18 U/L (ref 0–44)
AST: 20 U/L (ref 15–41)
Albumin: 4.5 g/dL (ref 3.5–5.0)
Alkaline Phosphatase: 66 U/L (ref 38–126)
Anion gap: 10 (ref 5–15)
BUN: 14 mg/dL (ref 8–23)
CO2: 25 mmol/L (ref 22–32)
Calcium: 8.9 mg/dL (ref 8.9–10.3)
Chloride: 106 mmol/L (ref 98–111)
Creatinine, Ser: 0.78 mg/dL (ref 0.44–1.00)
GFR, Estimated: >60 mL/min
Glucose, Bld: 107 mg/dL — ABNORMAL HIGH (ref 70–99)
Potassium: 4 mmol/L (ref 3.5–5.1)
Sodium: 140 mmol/L (ref 135–145)
Total Bilirubin: 0.2 mg/dL (ref 0.0–1.2)
Total Protein: 7.2 g/dL (ref 6.5–8.1)

## 2024-11-13 LAB — CBC
HCT: 39.3 % (ref 36.0–46.0)
Hemoglobin: 12.7 g/dL (ref 12.0–15.0)
MCH: 30.8 pg (ref 26.0–34.0)
MCHC: 32.3 g/dL (ref 30.0–36.0)
MCV: 95.4 fL (ref 80.0–100.0)
Platelets: 266 K/uL (ref 150–400)
RBC: 4.12 MIL/uL (ref 3.87–5.11)
RDW: 12.6 % (ref 11.5–15.5)
WBC: 6.8 K/uL (ref 4.0–10.5)
nRBC: 0 % (ref 0.0–0.2)

## 2024-11-13 LAB — URINALYSIS, ROUTINE W REFLEX MICROSCOPIC
Bilirubin Urine: NEGATIVE
Glucose, UA: NEGATIVE mg/dL
Hgb urine dipstick: NEGATIVE
Ketones, ur: NEGATIVE mg/dL
Leukocytes,Ua: NEGATIVE
Nitrite: NEGATIVE
Protein, ur: NEGATIVE mg/dL
Specific Gravity, Urine: 1.002 — ABNORMAL LOW (ref 1.005–1.030)
pH: 6 (ref 5.0–8.0)

## 2024-11-13 NOTE — ED Triage Notes (Signed)
 First Nurse Note:  Pt via [facility] from home. Pt son called her this morning and said she is more confused than normally. Pt has hx of dementia. Pt c/o generalized weakness and loss of appetite. Pt is alert but confused but able to answer questions.  152/80 BP 65 HR  122 CBG  98.0 orally  97% on RA   [name] (pt's grandson) [phone]

## 2024-11-13 NOTE — ED Provider Notes (Signed)
 "  [facility] Provider Note    Event Date/Time   First MD Initiated Contact with Patient 11/13/24 1341     (approximate)   History   Weakness   HPI  Jennifer Larsen is a 89 y.o. female presenting with concern of confusion.  She apparently has worsening dementia been developing over the last few years now.  She lives by herself, seems that she has been increasingly forgetful and having trouble taking care of herself. Her aunt usually helps provide her with meals but she has not been eating her meals, and has not been drinking water very well.  Family was concerned as she has not been eating well and that she may be significantly dehydrated but had the patient brought to the emergency department.  Obtain history from her grandson, [name].  Patient unfortunately is a poor historian does not remember why she was brought here.   Physical Exam   Triage Vital Signs: ED Triage Vitals [11/13/24 1236]  Encounter Vitals Group     BP      Girls Systolic BP Percentile      Girls Diastolic BP Percentile      Boys Systolic BP Percentile      Boys Diastolic BP Percentile      Pulse      Resp      Temp      Temp src      SpO2      Weight      Height      Head Circumference      Peak Flow      Pain Score 5     Pain Loc      Pain Education      Exclude from Growth Chart     Most recent vital signs: Vitals:   11/13/24 1413  BP: (!) 144/57  Pulse: 60  Resp: 17  Temp: 98.7 F (37.1 C)  SpO2: 100%     General: Awake, no distress.  CV:  Good peripheral perfusion.  Resp:  Normal effort.  Abd:  No distention.  Soft nontender Neuro:  Alert and oriented to self, disoriented to location and time.  Cranial nerves II to XII are intact, no difficulty with word finding or slurred speech, appropriate strength sensation and coordination in all extremities Other:     ED Results / Procedures / Treatments   Labs (all labs ordered are listed, but only abnormal  results are displayed) Labs Reviewed  COMPREHENSIVE METABOLIC PANEL WITH GFR - Abnormal; Notable for the following components:      Result Value   Glucose, Bld 107 (*)    All other components within normal limits  URINALYSIS, ROUTINE W REFLEX MICROSCOPIC - Abnormal; Notable for the following components:   Color, Urine STRAW (*)    APPearance CLEAR (*)    Specific Gravity, Urine 1.002 (*)    All other components within normal limits  CBC  CBG MONITORING, ED     EKG  On my independent interpretation of this EKG appears to be a sinus rhythm with rate of about 70, axis of about 70, left bundle branch block is present, I do not appreciate any obvious ischemia, some questionable ST depressions in lead II and aVF but these seem to have been present prior EKG   RADIOLOGY   PROCEDURES:  Critical Care performed: No  Procedures   MEDICATIONS ORDERED IN ED: Medications - No data to display   IMPRESSION / MDM / ASSESSMENT  AND PLAN / ED COURSE  I reviewed the triage vital signs and the nursing notes.                               Patient's presentation is most consistent with exacerbation of chronic illness.  89 year old female history of worsening dementia presenting today with concern of weakness and possible dehydration.  Fortunately she clinically appears well her labs are reassuring, on stroke assessment she seems to be about her baseline.  I spoke with her grandson, seems there is concern of patient not eating very much.  Patient states that she feels well and she has no complaints.  [name] is arriving at bedside to provide further history, will discuss with him in terms of ultimate goals of care and management.  Awaiting CT imaging of the head results at this time.   Clinical Course as of 11/13/24 1523  [place] Nov 13, 2024  1438 I spoke with the patient's grandson at length, he is planning to move onto her property to help care for her, he is requesting how we may get her  license revoked as he does not feel that she is safe to drive at this time.  He is comfortable taking her home, her legal guardian who is her aunt will also confirm with the grandson over the phone, if CT imaging is reassuring plan for discharge home afterwards. [SK]  1517 CT imaging without acute findings, will have the patient discharged back to care of grandson at this time. [SK]  1522 Spoke with patient's grandson who again is comfortable taking the patient home at this time.  I discussed with him that they would probably be great utility in discussing palliative care with patient's primary doctor and what this would entail.  It may also be worth discussing with social services in regards to how to get patient's driver's license revoked this unfortunately we are not able to perform this here at this time. [SK]    Clinical Course User Index [SK] Fernand Rossie HERO, MD     FINAL CLINICAL IMPRESSION(S) / ED DIAGNOSES   Final diagnoses:  Generalized weakness  Moderate dementia, unspecified dementia type, unspecified whether behavioral, psychotic, or mood disturbance or anxiety (HCC)     Rx / DC Orders   ED Discharge Orders     None        Note:  This document was prepared using Dragon voice recognition software and may include unintentional dictation errors.   Fernand Rossie HERO, MD 11/13/24 1523  "

## 2024-11-13 NOTE — Discharge Instructions (Signed)
 You were seen today due to concern of poor appetite.  At this time fortunately your labs and imaging are reassuring.  I would recommend following up with your primary doctor to discuss further management of your symptoms.  If you have any worsening of symptoms please return to the emergency department immediately for further medical management.
